# Patient Record
Sex: Female | Born: 1995 | Race: Black or African American | Hispanic: No | Marital: Single | Smoking: Never smoker
Health system: Southern US, Community
[De-identification: ages and names within clinical notes are randomized; demographics above are authoritative.]

## PROBLEM LIST (undated history)

## (undated) DIAGNOSIS — D649 Anemia, unspecified: Secondary | ICD-10-CM

## (undated) DIAGNOSIS — Z789 Other specified health status: Secondary | ICD-10-CM

## (undated) HISTORY — DX: Other specified health status: Z78.9

## (undated) HISTORY — PX: EYE SURGERY: SHX253

## (undated) HISTORY — DX: Anemia, unspecified: D64.9

## (undated) HISTORY — PX: STRABISMUS SURGERY: SHX218

---
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
## 2016-09-26 ENCOUNTER — Encounter: Payer: Self-pay | Admitting: Unknown Physician Specialty

## 2016-09-26 ENCOUNTER — Ambulatory Visit (INDEPENDENT_AMBULATORY_CARE_PROVIDER_SITE_OTHER): Payer: Managed Care, Other (non HMO) | Admitting: Unknown Physician Specialty

## 2016-09-26 VITALS — BP 106/68 | HR 64 | Temp 98.2°F | Ht 67.0 in | Wt 156.2 lb

## 2016-09-26 DIAGNOSIS — Z23 Encounter for immunization: Secondary | ICD-10-CM

## 2016-09-26 DIAGNOSIS — Z0000 Encounter for general adult medical examination without abnormal findings: Secondary | ICD-10-CM | POA: Diagnosis not present

## 2016-09-26 MED ORDER — LEVONORGESTREL-ETHINYL ESTRAD 0.1-20 MG-MCG PO TABS
1.0000 | ORAL_TABLET | Freq: Every day | ORAL | 3 refills | Status: DC
Start: 2016-09-26 — End: 2017-09-28

## 2016-09-26 NOTE — Progress Notes (Signed)
BP 106/68 (BP Location: Left Arm, Patient Position: Sitting, Cuff Size: Normal)   Pulse 64   Temp 98.2 F (36.8 C)   Ht 5\' 7"  (1.702 m)   Wt 156 lb 3.2 oz (70.9 kg)   LMP 09/15/2016 (Approximate)   SpO2 100%   BMI 24.46 kg/m    Subjective:    Patient ID: Janet Roach, female    DOB: 07/14/1996, 20 y.o.   MRN: [id number]  HPI: Janet Roach is a 20 y.o. female  Chief Complaint  Patient presents with  . Annual Exam   Acne Improved with BCPs  Social History   Social History  . Marital status: Single    Spouse name: N/A  . Number of children: N/A  . Years of education: N/A   Occupational History  . Not on file.   Social History Main Topics  . Smoking status: Never Smoker  . Smokeless tobacco: Never Used  . Alcohol use Yes     Comment: on occasion  . Drug use:     Types: Marijuana  . Sexual activity: Yes   Other Topics Concern  . Not on file   Social History Narrative  . No narrative on file   Family History  Problem Relation Age of Onset  . Hypertension Mother   . Diabetes Maternal Grandmother   . Hypertension Maternal Grandmother   . Dementia Maternal Grandmother   . Cancer Paternal Grandmother     breast  . Cancer Paternal Grandfather    History reviewed. No pertinent past medical history.  .   Relevant past medical, surgical, family and social history reviewed and updated as indicated. Interim medical history since our last visit reviewed. Allergies and medications reviewed and updated.  Review of Systems  Per HPI unless specifically indicated above     Objective:    BP 106/68 (BP Location: Left Arm, Patient Position: Sitting, Cuff Size: Normal)   Pulse 64   Temp 98.2 F (36.8 C)   Ht 5\' 7"  (1.702 m)   Wt 156 lb 3.2 oz (70.9 kg)   LMP 09/15/2016 (Approximate)   SpO2 100%   BMI 24.46 kg/m   Wt Readings from Last 3 Encounters:  09/26/16 156 lb 3.2 oz (70.9 kg) (85 %, Z= 1.03)*  09/22/15 174 lb 6.4 oz (79.1 kg) (94 %, Z= 1.53)*    11/21/10 141 lb (64 kg) (88 %, Z= 1.19)*   * Growth percentiles are based on CDC 2-20 Years data.    Physical Exam  Constitutional: She is oriented to person, place, and time. She appears well-developed and well-nourished. No distress.  HENT:  Head: Normocephalic and atraumatic.  Eyes: Conjunctivae and lids are normal. Right eye exhibits no discharge. Left eye exhibits no discharge. No scleral icterus.  Neck: Normal range of motion. Neck supple. No JVD present. Carotid bruit is not present.  Cardiovascular: Normal rate, regular rhythm and normal heart sounds.   Pulmonary/Chest: Effort normal and breath sounds normal.  Abdominal: Soft. Normal appearance and bowel sounds are normal. There is no splenomegaly or hepatomegaly. There is no tenderness.  Musculoskeletal: Normal range of motion.  Neurological: She is alert and oriented to person, place, and time.  Skin: Skin is warm, dry and intact. No rash noted. No pallor.  Psychiatric: She has a normal mood and affect. Her behavior is normal. Judgment and thought content normal.    Results for orders placed or performed in visit on 09/22/15  GC/Chlamydia Probe Amp  Result Value Ref Range  Chlamydia trachomatis, NAA Negative Negative   Neisseria gonorrhoeae by PCR Negative Negative  UA/M w/rflx Culture, Routine  Result Value Ref Range   Specific Gravity, UA 1.020 1.005 - 1.030   pH, UA 7.0 5.0 - 7.5   Color, UA Yellow Yellow   Appearance Ur Cloudy (A) Clear   Leukocytes, UA Negative Negative   Protein, UA Negative Negative/Trace   Glucose, UA Negative Negative   Ketones, UA Trace (A) Negative   RBC, UA Negative Negative   Bilirubin, UA Negative Negative   Urobilinogen, Ur 0.2 0.2 - 1.0 mg/dL   Nitrite, UA Negative Negative  HIV antibody  Result Value Ref Range   HIV Screen 4th Generation wRfx Non Reactive Non Reactive  CBC with Differential/Platelet  Result Value Ref Range   WBC 9.4 3.4 - 10.8 x10E3/uL   RBC 4.16 3.77 - 5.28  x10E6/uL   Hemoglobin 12.2 11.1 - 15.9 g/dL   Hematocrit 16.137.0 09.634.0 - 46.6 %   MCV 89 79 - 97 fL   MCH 29.3 26.6 - 33.0 pg   MCHC 33.0 31.5 - 35.7 g/dL   RDW 04.513.1 40.912.3 - 81.115.4 %   Platelets 275 150 - 379 x10E3/uL   Neutrophils 68 %   Lymphs 25 %   Monocytes 7 %   Eos 0 %   Basos 0 %   Neutrophils Absolute 6.3 1.4 - 7.0 x10E3/uL   Lymphocytes Absolute 2.4 0.7 - 3.1 x10E3/uL   Monocytes Absolute 0.7 0.1 - 0.9 x10E3/uL   EOS (ABSOLUTE) 0.0 0.0 - 0.4 x10E3/uL   Basophils Absolute 0.0 0.0 - 0.2 x10E3/uL   Immature Granulocytes 0 %   Immature Grans (Abs) 0.0 0.0 - 0.1 x10E3/uL  Lipid Panel w/o Chol/HDL Ratio  Result Value Ref Range   Cholesterol, Total 148 100 - 169 mg/dL   Triglycerides 45 0 - 89 mg/dL   HDL 58 >91>39 mg/dL   VLDL Cholesterol Cal 9 5 - 40 mg/dL   LDL Calculated 81 0 - 109 mg/dL  + +    Assessment & Plan:   Problem List Items Addressed This Visit    None    Visit Diagnoses    Need for influenza vaccination    -  Primary   Relevant Orders   Flu Vaccine QUAD 36+ mos IM (Completed)   Annual physical exam           Follow up plan: Return in about 1 year (around 09/26/2017).

## 2016-09-26 NOTE — Patient Instructions (Signed)

## 2017-06-26 LAB — OB RESULTS CONSOLE HIV ANTIBODY (ROUTINE TESTING): HIV: NONREACTIVE

## 2017-06-27 LAB — OB RESULTS CONSOLE VARICELLA ZOSTER ANTIBODY, IGG: Varicella: IMMUNE

## 2017-06-27 LAB — OB RESULTS CONSOLE RUBELLA ANTIBODY, IGM: RUBELLA: IMMUNE

## 2017-06-28 LAB — OB RESULTS CONSOLE GC/CHLAMYDIA
CHLAMYDIA, DNA PROBE: NEGATIVE
Gonorrhea: NEGATIVE

## 2017-07-27 LAB — OB RESULTS CONSOLE RPR: RPR: NONREACTIVE

## 2017-07-27 LAB — OB RESULTS CONSOLE HEPATITIS B SURFACE ANTIGEN: HEP B S AG: NEGATIVE

## 2017-09-28 ENCOUNTER — Encounter: Payer: Self-pay | Admitting: Unknown Physician Specialty

## 2017-09-28 ENCOUNTER — Other Ambulatory Visit: Payer: Self-pay | Admitting: Unknown Physician Specialty

## 2017-09-28 ENCOUNTER — Ambulatory Visit (INDEPENDENT_AMBULATORY_CARE_PROVIDER_SITE_OTHER): Payer: 59 | Admitting: Unknown Physician Specialty

## 2017-09-28 VITALS — BP 118/72 | HR 69 | Temp 98.6°F | Ht 66.8 in | Wt 175.8 lb

## 2017-09-28 DIAGNOSIS — Z0000 Encounter for general adult medical examination without abnormal findings: Secondary | ICD-10-CM | POA: Diagnosis not present

## 2017-09-28 DIAGNOSIS — Z3A24 24 weeks gestation of pregnancy: Secondary | ICD-10-CM | POA: Diagnosis not present

## 2017-09-28 NOTE — Addendum Note (Signed)
Addended by: Gabriel CirriWICKER, Shelbie Franken on: 09/28/2017 12:16 PM   Modules accepted: Orders

## 2017-09-28 NOTE — Assessment & Plan Note (Addendum)
Recommended TDAP on third trimester.  Had a flu shot

## 2017-09-28 NOTE — Progress Notes (Signed)
BP 118/72   Pulse 69   Temp 98.6 F (37 C) (Oral)   Ht 5' 6.8" (1.697 m)   Wt 175 lb 12.8 oz (79.7 kg)   LMP 05/16/2017 (Approximate)   SpO2 100%   BMI 27.70 kg/m    Subjective:    Patient ID: Janet Roach, female    DOB: 10/15/1996, 21 y.o.   MRN: [id number]  HPI: Janet Roach is a 21 y.o. female  Chief Complaint  Patient presents with  . Annual Exam   Pt is [redacted] weeks pregnant at this time.  She is getting [facility]. She is taking pre-natal vitamins.     States her diet and exercise if "fine."    Social History   Socioeconomic History  . Marital status: Single    Spouse name: Not on file  . Number of children: Not on file  . Years of education: Not on file  . Highest education level: Not on file  Social Needs  . Financial resource strain: Not on file  . Food insecurity - worry: Not on file  . Food insecurity - inability: Not on file  . Transportation needs - medical: Not on file  . Transportation needs - non-medical: Not on file  Occupational History  . Not on file  Tobacco Use  . Smoking status: Never Smoker  . Smokeless tobacco: Never Used  Substance and Sexual Activity  . Alcohol use: Yes    Comment: on occasion  . Drug use: No  . Sexual activity: Yes  Other Topics Concern  . Not on file  Social History Narrative  . Not on file   Family History  Problem Relation Age of Onset  . Hypertension Mother   . Diabetes Maternal Grandmother   . Hypertension Maternal Grandmother   . Dementia Maternal Grandmother   . Cancer Paternal Grandmother        breast  . Cancer Paternal Grandfather    History reviewed. No pertinent past medical history.  Past Surgical History:  Procedure Laterality Date  . STRABISMUS SURGERY       Relevant past medical, surgical, family and social history reviewed and updated as indicated. Interim medical history since our last visit reviewed. Allergies and medications reviewed and updated.  Review of  Systems  Constitutional: Negative.   HENT: Negative.   Eyes: Negative.   Respiratory: Negative.   Cardiovascular: Negative.   Gastrointestinal: Negative.   Endocrine: Negative.   Genitourinary: Negative.   Musculoskeletal: Negative.   Skin: Negative.   Allergic/Immunologic: Negative.   Neurological: Negative.   Hematological: Negative.   Psychiatric/Behavioral: Negative.     Per HPI unless specifically indicated above     Objective:    BP 118/72   Pulse 69   Temp 98.6 F (37 C) (Oral)   Ht 5' 6.8" (1.697 m)   Wt 175 lb 12.8 oz (79.7 kg)   LMP 05/16/2017 (Approximate)   SpO2 100%   BMI 27.70 kg/m   Wt Readings from Last 3 Encounters:  09/28/17 175 lb 12.8 oz (79.7 kg)  09/26/16 156 lb 3.2 oz (70.9 kg) (85 %, Z= 1.03)*  09/22/15 174 lb 6.4 oz (79.1 kg) (94 %, Z= 1.53)*   * Growth percentiles are based on CDC (Girls, 2-20 Years) data.    Physical Exam  Constitutional: She is oriented to person, place, and time. She appears well-developed and well-nourished. No distress.  HENT:  Head: Normocephalic and atraumatic.  Eyes: Conjunctivae and lids are normal.  Right eye exhibits no discharge. Left eye exhibits no discharge. No scleral icterus.  Neck: Normal range of motion. Neck supple. No JVD present. Carotid bruit is not present.  Cardiovascular: Normal rate, regular rhythm and normal heart sounds.  Pulmonary/Chest: Effort normal and breath sounds normal.  Abdominal:  Pregnant [redacted] weeks  Musculoskeletal: Normal range of motion.  Neurological: She is alert and oriented to person, place, and time.  Skin: Skin is warm, dry and intact. No rash noted. No pallor.  Psychiatric: She has a normal mood and affect. Her behavior is normal. Judgment and thought content normal.     Assessment & Plan:   Problem List Items Addressed This Visit      Unprioritized   [redacted] weeks gestation of pregnancy    Recommended TDAP on third trimester.  Had a flu shot       Other Visit Diagnoses     Annual physical exam    -  Primary       Follow up plan: Return in about 1 year (around 09/28/2018).

## 2017-09-29 LAB — RPR: RPR: NONREACTIVE

## 2017-09-29 LAB — HIV ANTIBODY (ROUTINE TESTING W REFLEX): HIV Screen 4th Generation wRfx: NONREACTIVE

## 2017-10-01 ENCOUNTER — Encounter: Payer: Self-pay | Admitting: Unknown Physician Specialty

## 2017-10-04 LAB — SPECIMEN STATUS REPORT

## 2017-10-04 LAB — GC/CHLAMYDIA PROBE AMP
Chlamydia trachomatis, NAA: NEGATIVE
Neisseria gonorrhoeae by PCR: NEGATIVE

## 2017-10-05 ENCOUNTER — Encounter: Payer: Self-pay | Admitting: Unknown Physician Specialty

## 2017-10-16 NOTE — L&D Delivery Note (Signed)
Delivery Summary for Janet Roach  Labor Events:   Preterm labor:   Rupture date: 01/10/2018  Rupture time: 11:00 PM  Rupture type: Spontaneous  Fluid Color:   Induction:   Augmentation:   Complications:   Cervical ripening:          Delivery:   Episiotomy:   Lacerations:   Repair suture:   Repair # of packets:   Blood loss (ml): 600   Information for the patient's newborn:  Janet Roach, Janet Roach [[id number]][[id number]]    Delivery 01/11/2018 10:09 PM by  Vaginal, Spontaneous Sex:  female Gestational Age: 5630w4d Delivery Clinician:   Living?:         APGARS  One minute Five minutes Ten minutes  Skin color:        Heart rate:        Grimace:        Muscle tone:        Breathing:        Totals: 8  9      Presentation/position:      Resuscitation:   Cord information:    Disposition of cord blood:     Blood gases sent?  Complications:   Placenta: Delivered:       appearance Newborn Measurements: Weight: 7 lb 7.9 oz (3400 g)  Height: 20"  Head circumference:    Chest circumference:    Other providers:    Additional  information: Forceps:   Vacuum:   Breech:   Observed anomalies       Delivery Note At 10:09 PM a viable and healthy female was delivered via Vaginal, Spontaneous (Presentation: Vertex; ROA position).  APGAR: 8, 9; weight 7 lb 7.9 oz (3400 g).   Placenta status: spontaneously removed, intact.  Cord: 3-vessel, with the following complications: None.  Cord pH: not obtained.  Anesthesia:  Epidural Episiotomy: None Lacerations: 2nd degree;Perineal;Vaginal Suture Repair: 2.0 vicryl Est. Blood Loss (mL):  600  Mom to postpartum.  Baby to Couplet care / Skin to Skin.  Janet Roach 01/11/2018, 10:40 PM

## 2017-10-18 LAB — HM HIV SCREENING LAB: HM HIV Screening: NEGATIVE

## 2017-10-18 LAB — OB RESULTS CONSOLE HIV ANTIBODY (ROUTINE TESTING): HIV: NONREACTIVE

## 2017-10-19 LAB — OB RESULTS CONSOLE RPR: RPR: NONREACTIVE

## 2017-12-16 LAB — OB RESULTS CONSOLE GBS: GBS: NEGATIVE

## 2017-12-17 LAB — OB RESULTS CONSOLE GC/CHLAMYDIA
CHLAMYDIA, DNA PROBE: NEGATIVE
GC PROBE AMP, GENITAL: NEGATIVE

## 2018-01-11 ENCOUNTER — Other Ambulatory Visit: Payer: Self-pay

## 2018-01-11 ENCOUNTER — Inpatient Hospital Stay: Payer: 59 | Admitting: Anesthesiology

## 2018-01-11 ENCOUNTER — Inpatient Hospital Stay
Admission: EM | Admit: 2018-01-11 | Discharge: 2018-01-13 | DRG: 807 | Disposition: A | Discharge status: Home / Self Care | Payer: 59 | Attending: Obstetrics and Gynecology | Admitting: Obstetrics and Gynecology

## 2018-01-11 DIAGNOSIS — Z3A39 39 weeks gestation of pregnancy: Secondary | ICD-10-CM

## 2018-01-11 DIAGNOSIS — O4292 Full-term premature rupture of membranes, unspecified as to length of time between rupture and onset of labor: Secondary | ICD-10-CM | POA: Diagnosis present

## 2018-01-11 DIAGNOSIS — O4202 Full-term premature rupture of membranes, onset of labor within 24 hours of rupture: Secondary | ICD-10-CM | POA: Diagnosis not present

## 2018-01-11 DIAGNOSIS — Z3490 Encounter for supervision of normal pregnancy, unspecified, unspecified trimester: Secondary | ICD-10-CM

## 2018-01-11 DIAGNOSIS — O9081 Anemia of the puerperium: Secondary | ICD-10-CM | POA: Diagnosis not present

## 2018-01-11 LAB — URINE DRUG SCREEN, QUALITATIVE (ARMC ONLY)
Amphetamines, Ur Screen: NOT DETECTED
BARBITURATES, UR SCREEN: NOT DETECTED
BENZODIAZEPINE, UR SCRN: NOT DETECTED
CANNABINOID 50 NG, UR ~~LOC~~: NOT DETECTED
Cocaine Metabolite,Ur ~~LOC~~: NOT DETECTED
MDMA (Ecstasy)Ur Screen: NOT DETECTED
METHADONE SCREEN, URINE: NOT DETECTED
Opiate, Ur Screen: NOT DETECTED
PHENCYCLIDINE (PCP) UR S: NOT DETECTED
Tricyclic, Ur Screen: NOT DETECTED

## 2018-01-11 LAB — CBC
HCT: 33.2 % — ABNORMAL LOW (ref 35.0–47.0)
HEMOGLOBIN: 11.1 g/dL — AB (ref 12.0–16.0)
MCH: 28.1 pg (ref 26.0–34.0)
MCHC: 33.4 g/dL (ref 32.0–36.0)
MCV: 84.1 fL (ref 80.0–100.0)
PLATELETS: 222 10*3/uL (ref 150–440)
RBC: 3.95 MIL/uL (ref 3.80–5.20)
RDW: 16.7 % — ABNORMAL HIGH (ref 11.5–14.5)
WBC: 13.5 10*3/uL — ABNORMAL HIGH (ref 3.6–11.0)

## 2018-01-11 LAB — TYPE AND SCREEN
ABO/RH(D): A POS
ANTIBODY SCREEN: NEGATIVE

## 2018-01-11 LAB — ROM PLUS (ARMC ONLY): ROM PLUS: POSITIVE

## 2018-01-11 MED ORDER — LIDOCAINE HCL (PF) 1 % IJ SOLN: 30.0000 mL | INTRAMUSCULAR | Status: DC | PRN

## 2018-01-11 MED ORDER — FENTANYL 2.5 MCG/ML W/ROPIVACAINE 0.15% IN NS 100 ML EPIDURAL (ARMC)
EPIDURAL | Status: AC
  Filled 2018-01-11: qty 100

## 2018-01-11 MED ORDER — OXYTOCIN 10 UNIT/ML IJ SOLN
INTRAMUSCULAR | Status: AC
  Filled 2018-01-11: qty 2

## 2018-01-11 MED ORDER — LIDOCAINE HCL (PF) 1 % IJ SOLN
INTRAMUSCULAR | Status: DC | PRN
  Administered 2018-01-11: 3 mL via SUBCUTANEOUS

## 2018-01-11 MED ORDER — LIDOCAINE HCL (PF) 1 % IJ SOLN
INTRAMUSCULAR | Status: AC
  Filled 2018-01-11: qty 30

## 2018-01-11 MED ORDER — BUTORPHANOL TARTRATE 1 MG/ML IJ SOLN: 1.0000 mg | INTRAMUSCULAR | Status: DC | PRN

## 2018-01-11 MED ORDER — OXYTOCIN 40 UNITS IN LACTATED RINGERS INFUSION - SIMPLE MED
1.0000 m[IU]/min | INTRAVENOUS | Status: DC
  Administered 2018-01-11: 2 m[IU]/min via INTRAVENOUS
  Filled 2018-01-11: qty 1000

## 2018-01-11 MED ORDER — LACTATED RINGERS IV SOLN
INTRAVENOUS | Status: DC
  Administered 2018-01-11 (×3): via INTRAVENOUS

## 2018-01-11 MED ORDER — AMMONIA AROMATIC IN INHA
RESPIRATORY_TRACT | Status: AC
  Filled 2018-01-11: qty 10

## 2018-01-11 MED ORDER — MISOPROSTOL 200 MCG PO TABS
ORAL_TABLET | ORAL | Status: AC
  Filled 2018-01-11: qty 4

## 2018-01-11 MED ORDER — TERBUTALINE SULFATE 1 MG/ML IJ SOLN: 0.2500 mg | Freq: Once | INTRAMUSCULAR | Status: DC | PRN

## 2018-01-11 MED ORDER — FENTANYL 2.5 MCG/ML W/ROPIVACAINE 0.15% IN NS 100 ML EPIDURAL (ARMC)
EPIDURAL | Status: DC | PRN
  Administered 2018-01-11: 250 ug via EPIDURAL
  Administered 2018-01-11: 12 mL/h via EPIDURAL

## 2018-01-11 MED ORDER — BUPIVACAINE HCL (PF) 0.25 % IJ SOLN
INTRAMUSCULAR | Status: DC | PRN
  Administered 2018-01-11: 5 mL via EPIDURAL
  Administered 2018-01-11: 4 mL via EPIDURAL

## 2018-01-11 MED ORDER — LIDOCAINE-EPINEPHRINE (PF) 1.5 %-1:200000 IJ SOLN
INTRAMUSCULAR | Status: DC | PRN
  Administered 2018-01-11: 3 mL via EPIDURAL

## 2018-01-11 MED ORDER — LACTATED RINGERS IV SOLN
500.0000 mL | INTRAVENOUS | Status: DC | PRN
  Administered 2018-01-11: 1000 mL via INTRAVENOUS

## 2018-01-11 MED ORDER — OXYTOCIN BOLUS FROM INFUSION: 500.0000 mL | Freq: Once | INTRAVENOUS | Status: DC

## 2018-01-11 MED ORDER — OXYTOCIN 40 UNITS IN LACTATED RINGERS INFUSION - SIMPLE MED
2.5000 [IU]/h | INTRAVENOUS | Status: DC
  Filled 2018-01-11: qty 1000

## 2018-01-11 NOTE — Progress Notes (Signed)
Intrapartum Progress Note  S: Patient just had epidural placed.  No complaints. Very drowsy  O: Blood pressure 134/78, pulse 89, temperature 98.1 F (36.7 C), resp. rate 16, height 5\' 9"  (1.753 m), weight 202 lb (91.6 kg), last menstrual period 05/16/2017, SpO2 99 %. Gen App: NAD, comfortable, drowsy.  Abdomen: soft, gravid FHT: baseline 135 bpm.  Accels present.  Decels absent. moderate in degree variability.   Tocometer: contractions q 2-3 minutes Cervix: 4/80/-1/SROM (since ~0200) Extremities: Nontender, no edema.  Pitocin: 8 mIU  Labs:  No new labs   Assessment:  1: SIUP at 3125w4d 2. PROM, GBS neg  Plan:  1. Continue Pitocin augmentation per protocol 2. S/p epidural placement 3. Anticipate vaginal delivery   Hildred Laserherry, Houston Surges, MD 01/11/2018 11:43 AM

## 2018-01-11 NOTE — H&P (Signed)
History and Physical   HPI  Janet Roach is a 22 y.o. G1P0 at 5647w4d Estimated Date of Delivery: 01/14/18 who is being admitted for  labor management.  Pt had SROM sometime late yesterday evening.  [facility] and was sent home at 3cm dilation.  She presented here and was found to be 3 cm and having irregular contractions.  She c/o leakage of fluid.  A ROM + was performed and was c/w ROM.   OB History  OB History  Gravida Para Term Preterm AB Living  1 0 0 0 0 0  SAB TAB Ectopic Multiple Live Births  0 0 0 0 0    # Outcome Date GA Lbr Len/2nd Weight Sex Delivery Anes PTL Lv  1 Current             PROBLEM LIST  Pregnancy complications or risks: Patient Active Problem List   Diagnosis Date Noted  . Pregnancy 01/11/2018  . [redacted] weeks gestation of pregnancy 09/28/2017    Prenatal labs and studies: ABO, Rh: --/--/A POS (03/29 0515) Antibody: NEG (03/29 0515) Rubella:   RPR: Nonreactive (01/04 0000)  HBsAg: Negative (10/12 0000)  HIV: Non-reactive (01/03 0000)  ZOX:[id number]:Negative (03/03 0000)   History reviewed. No pertinent past medical history.   Past Surgical History:  Procedure Laterality Date  . STRABISMUS SURGERY       Medications    Current Discharge Medication List    CONTINUE these medications which have NOT CHANGED   Details  ferrous sulfate 325 (65 FE) MG tablet Take 325 mg by mouth daily with breakfast.    Prenatal Vit-Fe Fumarate-FA (PRENATAL MULTIVITAMIN) TABS tablet Take 1 tablet by mouth daily at 12 noon.         Allergies  Patient has no known allergies.  Review of Systems  Pertinent items are noted in HPI.  Physical Exam  BP 120/68 (BP Location: Right Arm)   Pulse 97   Temp 98.1 F (36.7 C) (Oral)   Resp 16   Ht 5\' 9"  (1.753 m)   Wt 202 lb (91.6 kg)   LMP 05/16/2017 (Approximate)   BMI 29.83 kg/m   Lungs:  CTA B Cardio: RRR without M/R/G Abd: Soft, gravid, NT Presentation: cephalic EXT: No C/C/ 1+  Edema DTRs: 2+ B CERVIX: 3 cm 90%  SROM - confirmed.  See Prenatal records for more detailed PE.     FHR:  Variability: Good {> 6 bpm)  Toco: Uterine Contractions: Irregular - Mild   Test Results  Results for orders placed or performed during the hospital encounter of 01/11/18 (from the past 24 hour(s))  ROM Plus ([facility] only)     Status: None   Collection Time: 01/11/18  2:58 AM  Result Value Ref Range   Rom Plus POSITIVE   CBC     Status: Abnormal   Collection Time: 01/11/18  5:15 AM  Result Value Ref Range   WBC 13.5 (H) 3.6 - 11.0 K/uL   RBC 3.95 3.80 - 5.20 MIL/uL   Hemoglobin 11.1 (L) 12.0 - 16.0 g/dL   HCT 98.133.2 (L) 19.135.0 - 47.847.0 %   MCV 84.1 80.0 - 100.0 fL   MCH 28.1 26.0 - 34.0 pg   MCHC 33.4 32.0 - 36.0 g/dL   RDW 29.516.7 (H) 62.111.5 - 30.814.5 %   Platelets 222 150 - 440 K/uL  Type and screen [facility]     Status: None   Collection Time: 01/11/18  5:15 AM  Result Value Ref Range   ABO/RH(D) A POS    Antibody Screen NEG    Sample Expiration      01/14/2018 Performed at [facility], [address] [place], [place] [address]    Group B Strep negative  Assessment   G1P0 at [redacted]w[redacted]d Estimated Date of Delivery: 01/14/18  The fetus is reassuring.  SROM  Patient Active Problem List   Diagnosis Date Noted  . Pregnancy 01/11/2018  . [redacted] weeks gestation of pregnancy 09/28/2017    Plan  1. Admit to L&D :   continue present management, IV Pitocin augmentation and anticipate vaginal delivery 2. EFM: -- Category 1 3. Epidural if desired. Stadol for IV pain until epidural requested. 4. Admission labs    Elonda Husky, M.D. 01/11/2018 8:31 AM

## 2018-01-11 NOTE — Anesthesia Preprocedure Evaluation (Signed)
Anesthesia Evaluation  Patient identified by MRN, date of birth, ID band Patient awake    Reviewed: Allergy & Precautions, H&P , NPO status , Patient's Chart, lab work & pertinent test results  History of Anesthesia Complications Negative for: history of anesthetic complications  Airway Mallampati: II       Dental no notable dental hx. (+) Teeth Intact   Pulmonary           Cardiovascular      Neuro/Psych    GI/Hepatic   Endo/Other    Renal/GU      Musculoskeletal   Abdominal   Peds  Hematology   Anesthesia Other Findings   Reproductive/Obstetrics (+) Pregnancy                             Anesthesia Physical Anesthesia Plan  ASA: II  Anesthesia Plan: Epidural   Post-op Pain Management:    Induction:   PONV Risk Score and Plan:   Airway Management Planned:   Additional Equipment:   Intra-op Plan:   Post-operative Plan:   Informed Consent: I have reviewed the patients History and Physical, chart, labs and discussed the procedure including the risks, benefits and alternatives for the proposed anesthesia with the patient or authorized representative who has indicated his/her understanding and acceptance.       Plan Discussed with: Anesthesiologist  Anesthesia Plan Comments:         Anesthesia Quick Evaluation  

## 2018-01-11 NOTE — Progress Notes (Signed)
Intrapartum Progress Note  S: Patient feeling pressure with contractions.  Epidural in place.   O: Blood pressure (!) 135/92, pulse (!) 108, temperature 98.3 F (36.8 C), temperature source Oral, resp. rate 16, height 5\' 9"  (1.753 m), weight 202 lb (91.6 kg), last menstrual period 05/16/2017, SpO2 99 %. Gen App: NAD, comfortable, drowsy.  Abdomen: soft, gravid FHT: baseline 15 bpm.  Accels present.  Decels absent. moderate in degree variability.   Tocometer: contractions q 2-3 minutes Cervix: 6/100/0/SROM (since ~0200), cervix mildly edematous.  Extremities: Nontender, no edema.  Pitocin: 18 mIU  Labs:  No new labs   Assessment:  1: SIUP at 7866w4d 2. PROM, GBS neg  Plan:  1. Continue Pitocin augmentation per protocol 2. IUPC placed if need to increase pitocin above 20 mIU. MVU's appear to be adequate currently.  3. Anticipate vaginal delivery   Hildred Laserherry, Rayce Brahmbhatt, MD 01/11/2018 5:37 PM

## 2018-01-11 NOTE — OB Triage Note (Signed)
Pt arrival to triage with c/o contractions every 2-753min for 2 days.  Pt also reports leaking of fluid.  Denies vaginal bleeding.  Pt states she is feeling baby move normally.  EFM and toco applied and tracing.

## 2018-01-11 NOTE — Anesthesia Procedure Notes (Signed)
Epidural  Start time: 01/11/2018 11:19 AM End time: 01/11/2018 11:32 AM  Staffing Anesthesiologist: Naomie DeanKephart, William K, MD Resident/CRNA: Irving BurtonBachich, Tatia Petrucci, CRNA Performed: resident/CRNA   Preanesthetic Checklist Completed: patient identified, site marked, surgical consent, pre-op evaluation, IV checked, risks and benefits discussed and monitors and equipment checked  Epidural Patient position: sitting Prep: Betadine Patient monitoring: heart rate, continuous pulse ox and blood pressure Approach: midline Location: L3-L4 Injection technique: LOR air  Needle:  Needle type: Tuohy  Needle gauge: 17 G Needle length: 9 cm Needle insertion depth: 7 cm Catheter type: closed end flexible Catheter size: 19 Gauge Catheter at skin depth: 11 cm  Assessment Events: blood not aspirated, injection not painful, no injection resistance, negative IV test and no paresthesia  Additional Notes Reason for block:procedure for pain

## 2018-01-12 ENCOUNTER — Encounter: Payer: Self-pay | Admitting: Anesthesiology

## 2018-01-12 DIAGNOSIS — O4292 Full-term premature rupture of membranes, unspecified as to length of time between rupture and onset of labor: Secondary | ICD-10-CM

## 2018-01-12 DIAGNOSIS — O9081 Anemia of the puerperium: Secondary | ICD-10-CM

## 2018-01-12 LAB — CBC
HEMATOCRIT: 28.1 % — AB (ref 35.0–47.0)
HEMOGLOBIN: 9.1 g/dL — AB (ref 12.0–16.0)
MCH: 27.4 pg (ref 26.0–34.0)
MCHC: 32.4 g/dL (ref 32.0–36.0)
MCV: 84.6 fL (ref 80.0–100.0)
Platelets: 194 10*3/uL (ref 150–440)
RBC: 3.32 MIL/uL — ABNORMAL LOW (ref 3.80–5.20)
RDW: 17.1 % — ABNORMAL HIGH (ref 11.5–14.5)
WBC: 16.7 10*3/uL — ABNORMAL HIGH (ref 3.6–11.0)

## 2018-01-12 LAB — RAPID HIV SCREEN (HIV 1/2 AB+AG)
HIV 1/2 ANTIBODIES: NONREACTIVE
HIV-1 P24 Antigen - HIV24: NONREACTIVE

## 2018-01-12 LAB — RPR: RPR Ser Ql: NONREACTIVE

## 2018-01-12 MED ORDER — IBUPROFEN 800 MG PO TABS
800.0000 mg | ORAL_TABLET | Freq: Four times a day (QID) | ORAL | Status: DC
  Administered 2018-01-12 – 2018-01-13 (×7): 800 mg via ORAL
  Filled 2018-01-12 (×7): qty 1

## 2018-01-12 MED ORDER — SENNOSIDES-DOCUSATE SODIUM 8.6-50 MG PO TABS
2.0000 | ORAL_TABLET | ORAL | Status: DC
  Administered 2018-01-12 – 2018-01-13 (×2): 2 via ORAL
  Filled 2018-01-12 (×2): qty 2

## 2018-01-12 MED ORDER — TETANUS-DIPHTH-ACELL PERTUSSIS 5-2.5-18.5 LF-MCG/0.5 IM SUSP: 0.5000 mL | Freq: Once | INTRAMUSCULAR | Status: DC

## 2018-01-12 MED ORDER — ZOLPIDEM TARTRATE 5 MG PO TABS: 5.0000 mg | ORAL_TABLET | Freq: Every evening | ORAL | Status: DC | PRN

## 2018-01-12 MED ORDER — ACETAMINOPHEN 325 MG PO TABS: 650.0000 mg | ORAL_TABLET | ORAL | Status: DC | PRN

## 2018-01-12 MED ORDER — COCONUT OIL OIL
1.0000 | TOPICAL_OIL | Status: DC | PRN
Start: 2018-01-12 — End: 2018-01-13

## 2018-01-12 MED ORDER — ONDANSETRON HCL 4 MG/2ML IJ SOLN
4.0000 mg | INTRAMUSCULAR | Status: DC | PRN
Start: 2018-01-12 — End: 2018-01-13

## 2018-01-12 MED ORDER — PRENATAL MULTIVITAMIN CH
1.0000 | ORAL_TABLET | Freq: Every day | ORAL | Status: DC
  Administered 2018-01-12 – 2018-01-13 (×2): 1 via ORAL
  Filled 2018-01-12 (×2): qty 1

## 2018-01-12 MED ORDER — DIBUCAINE 1 % RE OINT: 1.0000 "application " | TOPICAL_OINTMENT | RECTAL | Status: DC | PRN

## 2018-01-12 MED ORDER — WITCH HAZEL-GLYCERIN EX PADS: 1.0000 "application " | MEDICATED_PAD | CUTANEOUS | Status: DC | PRN

## 2018-01-12 MED ORDER — DIPHENHYDRAMINE HCL 25 MG PO CAPS: 25.0000 mg | ORAL_CAPSULE | Freq: Four times a day (QID) | ORAL | Status: DC | PRN

## 2018-01-12 MED ORDER — SIMETHICONE 80 MG PO CHEW: 80.0000 mg | CHEWABLE_TABLET | ORAL | Status: DC | PRN

## 2018-01-12 MED ORDER — ONDANSETRON HCL 4 MG PO TABS: 4.0000 mg | ORAL_TABLET | ORAL | Status: DC | PRN

## 2018-01-12 MED ORDER — BENZOCAINE-MENTHOL 20-0.5 % EX AERO: 1.0000 "application " | INHALATION_SPRAY | CUTANEOUS | Status: DC | PRN

## 2018-01-12 NOTE — Anesthesia Postprocedure Evaluation (Signed)
Anesthesia Post Note  Patient: Perel Claassen  Procedure(s) Performed: AN AD HOC LABOR EPIDURAL  Patient location during evaluation: Mother Baby Anesthesia Type: Epidural Level of consciousness: awake and alert Pain management: pain level controlled Vital Signs Assessment: post-procedure vital signs reviewed and stable Respiratory status: spontaneous breathing, nonlabored ventilation and respiratory function stable Cardiovascular status: stable Postop Assessment: no headache, no backache and epidural receding Anesthetic complications: no     Last Vitals:  Vitals:   01/12/18 0614 01/12/18 0742  BP: (!) 120/59 126/82  Pulse: 86 91  Resp: 16 20  Temp: 36.9 C 36.5 C  SpO2: 99% 100%    Last Pain:  Vitals:   01/12/18 0742  TempSrc: Oral  PainSc:                  Dempsy Damiano S     

## 2018-01-12 NOTE — Anesthesia Postprocedure Evaluation (Signed)
Anesthesia Post Note  Patient: Janet Roach  Procedure(s) Performed: AN AD HOC LABOR EPIDURAL  Patient location during evaluation: Mother Baby Anesthesia Type: Epidural Level of consciousness: awake and alert Pain management: pain level controlled Vital Signs Assessment: post-procedure vital signs reviewed and stable Respiratory status: spontaneous breathing, nonlabored ventilation and respiratory function stable Cardiovascular status: stable Postop Assessment: no headache, no backache and epidural receding Anesthetic complications: no     Last Vitals:  Vitals:   01/12/18 0614 01/12/18 0742  BP: (!) 120/59 126/82  Pulse: 86 91  Resp: 16 20  Temp: 36.9 C 36.5 C  SpO2: 99% 100%    Last Pain:  Vitals:   01/12/18 0742  TempSrc: Oral  PainSc:                  Colvin Blatt S

## 2018-01-12 NOTE — Progress Notes (Signed)
Post Partum Day # 1, s/p SVD  Subjective: no complaints, up ad lib, voiding and tolerating PO  Objective: Temp:  [97.7 F (36.5 C)-99.7 F (37.6 C)] 97.7 F (36.5 C) (03/30 0742) Pulse Rate:  [64-125] 91 (03/30 0742) Resp:  [16-20] 20 (03/30 0742) BP: (84-146)/(38-94) 126/82 (03/30 0742) SpO2:  [96 %-100 %] 100 % (03/30 0742)  Physical Exam:  General: alert and no distress  Lungs: clear to auscultation bilaterally Breasts: normal appearance, no masses or tenderness Heart: regular rate and rhythm, S1, S2 normal, no murmur, click, rub or gallop Abdomen: soft, non-tender; bowel sounds normal; no masses,  no organomegaly Pelvis: Lochia: appropriate, Uterine Fundus: firm Extremities: DVT Evaluation: No evidence of DVT seen on physical exam. Negative Homan's sign. No cords or calf tenderness. No significant calf/ankle edema.  Recent Labs    01/11/18 0515 01/12/18 0614  HGB 11.1* 9.1*  HCT 33.2* 28.1*    Assessment/Plan: Doing well postpartum  Breastfeeding and formula feeding, Lactation consult as needed.  Circumcision prior to discharge  Contraception combined OCPs.  Plan for discharge tomorrow.     LOS: 1 day   Hildred Laserherry, Victorina Kable, MD [facility]

## 2018-01-13 DIAGNOSIS — O9081 Anemia of the puerperium: Secondary | ICD-10-CM

## 2018-01-13 DIAGNOSIS — O4292 Full-term premature rupture of membranes, unspecified as to length of time between rupture and onset of labor: Secondary | ICD-10-CM

## 2018-01-13 LAB — VARICELLA ZOSTER ANTIBODY, IGG: Varicella IgG: <135 index — ABNORMAL LOW (ref >165)

## 2018-01-13 LAB — RUBELLA SCREEN: Rubella: 5.84 index (ref >0.99)

## 2018-01-13 MED ORDER — IBUPROFEN 800 MG PO TABS: 800.0000 mg | ORAL_TABLET | Freq: Three times a day (TID) | ORAL | 1 refills | Status: DC | PRN

## 2018-01-13 MED ORDER — DOCUSATE SODIUM 100 MG PO CAPS: 100.0000 mg | ORAL_CAPSULE | Freq: Two times a day (BID) | ORAL | 2 refills | Status: AC | PRN

## 2018-01-13 NOTE — Discharge Summary (Signed)
OB Discharge Summary     Patient Name: Janet Roach DOB: May 14, 1996 MRN: [id number]  Date of admission: 01/11/2018 Delivering MD: Hildred Laser   Date of discharge: 01/13/2018  Admitting diagnosis: 39 weeks, PROM Intrauterine pregnancy: [redacted]w[redacted]d  Secondary diagnosis:  Active Problems:   Pregnancy  Additional problems: None     Discharge diagnosis: Term Pregnancy Delivered and Anemia                                                                                                Post partum procedures:None  Augmentation: Pitocin  Complications: None  Hospital course:  Induction of Labor With Vaginal Delivery   22 y.o. yo G1P1001 at [redacted]w[redacted]d was admitted to the hospital 01/11/2018 for induction of labor .  Indication for induction: PROM.  Patient had an uncomplicated labor course as follows: Membrane Rupture Time/Date: 11:00 PM ,01/10/2018   Intrapartum Procedures: Episiotomy: None [1]                                         Lacerations:  2nd degree [3];Perineal [11];Vaginal [6]  Patient had delivery of a Viable infant.  Information for the patient's newborn:  Sharonlee, Nine [[id number]]  Delivery Method: Vag-Spont   01/11/2018  Details of delivery can be found in separate delivery note.  Patient had a routine postpartum course. Patient is discharged home 01/13/18.  Physical exam  Vitals:   01/12/18 1948 01/12/18 1956 01/13/18 0025 01/13/18 0859  BP: 140/90 118/78 127/65 131/78  Pulse: (!) 105 96 76 89  Resp: 18  20 20   Temp: 97.9 F (36.6 C)  98.1 F (36.7 C) 98.1 F (36.7 C)  TempSrc: Oral  Oral Axillary  SpO2: 98%  100% 100%  Weight:      Height:       General: alert and no distress Lochia: appropriate Uterine Fundus: firm Incision: N/A DVT Evaluation: No evidence of DVT seen on physical exam. Negative Homan's sign. No cords or calf tenderness.   Labs: Lab Results  Component Value Date   WBC 16.7 (H) 01/12/2018   HGB 9.1 (L) 01/12/2018   HCT 28.1 (L)  01/12/2018   MCV 84.6 01/12/2018   PLT 194 01/12/2018   No flowsheet data found.  Discharge instruction: per After Visit Summary and "Baby and Me Booklet".  After visit meds:  Allergies as of 01/13/2018   No Known Allergies     Medication List    TAKE these medications   docusate sodium 100 MG capsule Commonly known as:  COLACE Take 1 capsule (100 mg total) by mouth 2 (two) times daily as needed for mild constipation.   ferrous sulfate 325 (65 FE) MG tablet Take 325 mg by mouth daily with breakfast.   ibuprofen 800 MG tablet Commonly known as:  ADVIL,MOTRIN Take 1 tablet (800 mg total) by mouth every 8 (eight) hours as needed.   prenatal multivitamin Tabs tablet Take 1 tablet by mouth daily at 12 noon.  Diet: routine diet  Activity: Advance as tolerated. Pelvic rest for 6 weeks.   Outpatient follow up:6 weeks at [facility] Follow up Appt: Future Appointments  Date Time Provider Department Center  10/01/2018  8:00 AM Gabriel CirriWicker, Cheryl, NP [facility]   Follow up Visit:No follow-ups on file.  Postpartum contraception: Combination OCPs  Newborn Data: Live born female  Birth Weight: 7 lb 7.9 oz (3400 g) APGAR: 8, 9  Newborn Delivery   Birth date/time:  01/11/2018 22:09:00 Delivery type:  Vaginal, Spontaneous     Baby Feeding: Bottle and Breast Disposition:home with mother   01/13/2018 Hildred LaserAnika Senora Lacson, MD

## 2018-01-13 NOTE — Progress Notes (Signed)
Post Partum Day # 2, s/p SVD  Subjective: no complaints, up ad lib, voiding and tolerating PO.  Patient notes she is ready to go home today.   Objective: Vitals:   01/12/18 1948 01/12/18 1956 01/13/18 0025 01/13/18 0859  BP: 140/90 118/78 127/65 131/78  Pulse: (!) 105 96 76 89  Resp: 18  20 20   Temp: 97.9 F (36.6 C)  98.1 F (36.7 C) 98.1 F (36.7 C)  TempSrc: Oral  Oral Axillary  SpO2: 98%  100% 100%  Weight:      Height:        Physical Exam:  General: alert and no distress  Lungs: clear to auscultation bilaterally Breasts: normal appearance, no masses or tenderness Heart: regular rate and rhythm, S1, S2 normal, no murmur, click, rub or gallop Abdomen: soft, non-tender; bowel sounds normal; no masses,  no organomegaly Pelvis: Lochia: appropriate, Uterine Fundus: firm Extremities: DVT Evaluation: No evidence of DVT seen on physical exam. Negative Homan's sign. No cords or calf tenderness. No significant calf/ankle edema.  Recent Labs    01/11/18 0515 01/12/18 0614  HGB 11.1* 9.1*  HCT 33.2* 28.1*    Assessment/Plan: Doing well postpartum  Breastfeeding and formula feeding, Lactation consult as needed.  Circumcision will be done at Pediatrician office.  Contraception combined OCPs. Mild anemia postpartum, will prescribe daily iron.   Plan for discharge today.  Will f/u at [facility].      LOS: 2 days   Hildred Laserherry, Rosco Harriott, MD [facility]

## 2018-01-13 NOTE — Progress Notes (Signed)
Discharge instructions given. Patient verbalizes understanding of teaching. Patient discharged home via wheelchair at 1400. 

## 2018-02-18 DIAGNOSIS — E663 Overweight: Secondary | ICD-10-CM | POA: Insufficient documentation

## 2018-02-18 LAB — HM PAP SMEAR: HM Pap smear: NEGATIVE

## 2018-10-01 ENCOUNTER — Encounter: Payer: Self-pay | Admitting: Nurse Practitioner

## 2018-10-01 ENCOUNTER — Encounter: Payer: 59 | Admitting: Unknown Physician Specialty

## 2019-05-13 ENCOUNTER — Telehealth: Payer: Self-pay | Admitting: Family Medicine

## 2019-05-13 NOTE — Telephone Encounter (Signed)
Returned patient phone call. Patient has scheduled FP appt 05/23/2019 for method revisit. Patient stated she will finish her pills on 05/20/2019. Instructed patient to abstain from sex once pills are finished on 05/20/2019 and to keep scheduled appt on 05/23/2019. Patient agreeable and verbalized understanding of same.  Hal Morales, RN

## 2019-05-22 DIAGNOSIS — E663 Overweight: Secondary | ICD-10-CM

## 2019-05-23 ENCOUNTER — Other Ambulatory Visit: Payer: Self-pay

## 2019-05-23 ENCOUNTER — Ambulatory Visit (LOCAL_COMMUNITY_HEALTH_CENTER): Payer: Medicaid Other

## 2019-05-23 VITALS — BP 109/78 | Wt 191.0 lb

## 2019-05-23 DIAGNOSIS — Z3009 Encounter for other general counseling and advice on contraception: Secondary | ICD-10-CM

## 2019-05-24 MED ORDER — NORGESTIM-ETH ESTRAD TRIPHASIC 0.18/0.215/0.25 MG-25 MCG PO TABS: 1.0000 | ORAL_TABLET | Freq: Every day | ORAL | 5 refills | Status: DC

## 2019-05-24 NOTE — Patient Instructions (Signed)
Call for appt when patient opens last pack of pills.

## 2019-05-24 NOTE — Progress Notes (Signed)
Rx for Trisprintec called into [facility] per C. [place] PA VO Aileen Fass, RN

## 2020-02-29 ENCOUNTER — Other Ambulatory Visit: Payer: Self-pay | Admitting: Physician Assistant

## 2020-03-02 NOTE — Telephone Encounter (Signed)
Per chart review, patient with last RP 02/18/2018.  CBE due 2019 and pap due 2022.  Patient needs to RTC for annual visit prior to any further refills.

## 2020-10-16 NOTE — L&D Delivery Note (Addendum)
       Delivery Note   Janet Roach is a 25 y.o. G2P2002 at [redacted]w[redacted]d Estimated Date of Delivery: 08/19/21  PRE-OPERATIVE DIAGNOSIS:  1) [redacted]w[redacted]d pregnancy.  2) Labor   POST-OPERATIVE DIAGNOSIS:  1) [redacted]w[redacted]d pregnancy s/p   2) Viable female infant  Delivery Type:   Spontaneous vaginal delivery  Delivery Anesthesia:  Epidural  Labor Complications:  Mod meconium    ESTIMATED BLOOD LOSS: 250 ml    FINDINGS:   1) female infant, Apgar scores of    at 1 minute and    at 5 minutes and a birthweight of   ounces.    2) Nuchal cord: No  SPECIMENS:   PLACENTA:   Appearance:     Removal:       Disposition:    DISPOSITION:  Infant to left in stable condition in the delivery room, with L&D personnel and mother,  NARRATIVE SUMMARY: Labor course:  [name] is a [id number] at [redacted]w[redacted]d who presented for labor management.  She progressed well in labor without pitocin.  She received the appropriate anesthesia and proceeded to complete dilation. AROM was performed.  Moderate meconium was noted. She evidenced good maternal expulsive effort during the second stage. She went on to deliver a viable infant. The placenta delivered without problems and was noted to be complete. A perineal and vaginal examination was performed. Episiotomy/Lacerations: Small R labial tear.  Episiotomy or lacerations were repaired with Vicryl suture. The patient tolerated this well.  Elonda Husky, M.D. 08/24/2021 3:55 PM

## 2020-12-02 ENCOUNTER — Other Ambulatory Visit: Payer: Self-pay | Admitting: Physician Assistant

## 2020-12-04 ENCOUNTER — Other Ambulatory Visit: Payer: Self-pay | Admitting: Physician Assistant

## 2021-01-06 ENCOUNTER — Other Ambulatory Visit: Payer: Self-pay

## 2021-01-06 ENCOUNTER — Ambulatory Visit (LOCAL_COMMUNITY_HEALTH_CENTER): Payer: Medicaid Other

## 2021-01-06 VITALS — BP 135/79 | Ht 69.0 in | Wt 176.5 lb

## 2021-01-06 DIAGNOSIS — Z3201 Encounter for pregnancy test, result positive: Secondary | ICD-10-CM | POA: Diagnosis not present

## 2021-01-06 LAB — PREGNANCY, URINE: Preg Test, Ur: POSITIVE — AB

## 2021-01-06 MED ORDER — PRENATAL 27-0.8 MG PO TABS: 1.0000 | ORAL_TABLET | Freq: Every day | ORAL | 0 refills | Status: AC

## 2021-01-06 NOTE — Progress Notes (Signed)
UPT positive. Plans prenatal care at [facility]. To clerk for preadmit. Jerel Shepherd, RN

## 2021-01-27 NOTE — Progress Notes (Signed)
Abstract done per phone interview Henriette Combs RN 01/27/2021

## 2021-01-31 ENCOUNTER — Other Ambulatory Visit: Payer: Self-pay

## 2021-01-31 ENCOUNTER — Ambulatory Visit: Payer: Medicaid Other | Admitting: Family Medicine

## 2021-01-31 ENCOUNTER — Encounter: Payer: Self-pay | Admitting: Family Medicine

## 2021-01-31 VITALS — BP 125/87 | HR 62 | Temp 97.1°F | Ht 68.0 in | Wt 175.0 lb

## 2021-01-31 DIAGNOSIS — Z3480 Encounter for supervision of other normal pregnancy, unspecified trimester: Secondary | ICD-10-CM | POA: Insufficient documentation

## 2021-01-31 DIAGNOSIS — Z3401 Encounter for supervision of normal first pregnancy, first trimester: Secondary | ICD-10-CM | POA: Diagnosis not present

## 2021-01-31 DIAGNOSIS — Z3481 Encounter for supervision of other normal pregnancy, first trimester: Secondary | ICD-10-CM

## 2021-01-31 LAB — URINALYSIS
Bilirubin, UA: NEGATIVE
Glucose, UA: NEGATIVE
Ketones, UA: NEGATIVE
Leukocytes,UA: NEGATIVE
Nitrite, UA: NEGATIVE
Protein,UA: NEGATIVE
RBC, UA: NEGATIVE
Specific Gravity, UA: 1.02 (ref 1.005–1.030)
Urobilinogen, Ur: 0.2 mg/dL (ref 0.2–1.0)
pH, UA: 7 (ref 5.0–7.5)

## 2021-01-31 LAB — WET PREP FOR TRICH, YEAST, CLUE
Clue Cell Exam: POSITIVE — AB
Trichomonas Exam: NEGATIVE
Yeast Exam: NEGATIVE

## 2021-01-31 LAB — HEMOGLOBIN, FINGERSTICK: Hemoglobin: 13.6 g/dL (ref 11.1–15.9)

## 2021-01-31 NOTE — Progress Notes (Signed)
Patient here for new OB visit.  Patient reports daily vomiting in the morning but previously it was vomiting BID.   Wet prep reviewed with provider, Wendi Snipes, FNP. No further treatment indicated.   Hgb reviewed. WNL.   Urine drip reviewed with provider. WNL.   Floy Sabina, RN

## 2021-01-31 NOTE — Progress Notes (Signed)
[facility] HEALTH DEPT [facility] [address] [place] [address] [place] [address] [phone]  INITIAL PRENATAL VISIT NOTE  Subjective:  Janet Roach is a 25 y.o. G2P1001 at [redacted]w[redacted]d being seen today to start prenatal care at the [facility] Department.  She is currently monitored for the following issues for this low-risk pregnancy and has Overweight and Supervision of other normal pregnancy, antepartum on their problem list.  Patient reports that she was unsure about the pregnancy but is not good .  Patient lives with 3 yrs son here in [place], and works in [place].  FOB lives in [place] and is supportive of this pregnancy, he is currently living with a friend.     Patient denies any history of physical or sexual abuse, chronic medical issues, allergies or substance abuse. Last ETOH was 01/10/21.     Patient denies any concerning OB history including no preterm labor, LBW delivery or previous cesarean.  Patient desires to deliver at [facility].    Patient reports no complaints.  Contractions: Not present. Vag. Bleeding: None.  Movement: Absent. Denies leaking of fluid.   Indications for ASA therapy (per uptodate) One of the following: Previous pregnancy with preeclampsia, especially early onset and with an adverse outcome No Multifetal gestation No Chronic hypertension No Type 1 or 2 diabetes mellitus No Chronic kidney disease No Autoimmune disease (antiphospholipid syndrome, systemic lupus erythematosus) No  Two or more of the following: Nulliparity No Obesity (body mass index >30 kg/m2) No Family history of preeclampsia in mother or sister Yes Age ?35 years No Sociodemographic characteristics (African American race, low socioeconomic level) Yes Personal risk factors (eg, previous pregnancy with low birth weight or small for gestational age infant, previous adverse pregnancy outcome [eg, stillbirth], interval >10 years between  pregnancies) No   The following portions of the patient's history were reviewed and updated as appropriate: allergies, current medications, past family history, past medical history, past social history, past surgical history and problem list. Problem list updated.  Objective:   Vitals:   01/31/21 1327  BP: 125/87  Pulse: 62  Temp: (!) 97.1 F (36.2 C)  Weight: 175 lb (79.4 kg)  Height: 5\' 8"  (1.727 m)    Fetal Status:   Fundal Height: 11 cm Movement: Absent     Physical Exam Vitals and nursing note reviewed.  Constitutional:      General: She is not in acute distress.    Appearance: Normal appearance. She is well-developed.  HENT:     Head: Normocephalic and atraumatic.     Right Ear: External ear normal.     Left Ear: External ear normal.     Nose: Nose normal. No congestion or rhinorrhea.     Mouth/Throat:     Lips: Pink.     Mouth: Mucous membranes are moist.     Dentition: Normal dentition. No dental caries.     Pharynx: Oropharynx is clear. Uvula midline.     Comments: Dentition: teeth present no visible carries  Eyes:     General: No scleral icterus.    Conjunctiva/sclera: Conjunctivae normal.  Neck:     Thyroid: No thyroid mass or thyromegaly.  Cardiovascular:     Rate and Rhythm: Normal rate.     Pulses: Normal pulses.     Comments: Extremities are warm and well perfused Pulmonary:     Effort: Pulmonary effort is normal.     Breath sounds: Normal breath sounds.  Chest:  Chest wall: No mass.  Breasts:     Tanner Score is 5. Breasts are symmetrical.     Right: Normal. No mass, nipple discharge, skin change or axillary adenopathy.     Left: Normal. No mass, nipple discharge, skin change or axillary adenopathy.    Abdominal:     Palpations: Abdomen is soft.     Tenderness: There is no abdominal tenderness.     Comments: Gravid   Genitourinary:    General: Normal vulva.     Exam position: Lithotomy position.     Pubic Area: No rash.      Labia:         Right: No rash.        Left: No rash.      Vagina: Normal. No vaginal discharge.     Cervix: No cervical motion tenderness or friability.     Uterus: Normal. Enlarged (Gravid 11 size). Not tender.      Adnexa: Right adnexa normal and left adnexa normal.     Rectum: Normal. No external hemorrhoid.     Comments: External genitalia without, lice, nits, erythema, edema , lesions or inguinal adenopathy. Vagina with normal mucosa and discharge and pH equals 4.  Cervix without visual lesions, uterus firm, mobile, non-tender, no masses, CMT adnexal fullness or tenderness.  Musculoskeletal:     Cervical back: Normal range of motion and neck supple.     Right lower leg: No edema.     Left lower leg: No edema.  Lymphadenopathy:     Cervical: No cervical adenopathy.     Upper Body:     Right upper body: No axillary adenopathy.     Left upper body: No axillary adenopathy.  Skin:    General: Skin is warm.     Capillary Refill: Capillary refill takes less than 2 seconds.  Neurological:     Mental Status: She is alert and oriented to person, place, and time.  Psychiatric:        Behavior: Behavior normal.     Assessment and Plan:  Pregnancy: G2P1001 at [redacted]w[redacted]d  1. Encounter for supervision of normal first pregnancy in first trimester  - Dating: has sure LMP -11/12/2020, no dating U/S indicated. Referral sent for U/S.  - Genetic screening: declined FIRST, - Pregnancy sx: reports having vomiting in the mornings and some nausea throughout the day. Counseling given on preventive measures, informational sheet given.  - Up to date on dental care, last visit 1 month ago.  Reviewed safety of routine care in pregnancy  - Has support system that is involved including FOB - Routine labs completed. Pap done today.   - Vaccinations: declined Covid & flu today.  - Has two minor risk factors for preeclampsia. Recommended ASA 81mg  daily for preeclampsia prevention. Discussed starting at 11-13 weeks and  continuing through pregnancy. We will touch base at next appointment to see if she started this medication.     Discussed overview of care and coordination with inpatient delivery practices including [facility], , [facility] and [facility].    Preterm labor symptoms and general obstetric precautions including but not limited to vaginal bleeding, contractions, leaking of fluid and fetal movement were reviewed in detail with the patient.  Please refer to After Visit Summary for other counseling recommendations.   Return in about 4 weeks (around 02/28/2021) for routine prenatal care.  Future Appointments  Date Time Provider Department Center  02/28/2021  4:00 PM [facility] PROVIDER [facility] None  03/25/2021  2:30  PM [facility]    Wendi Snipes, FNP

## 2021-02-01 ENCOUNTER — Telehealth: Payer: Self-pay | Admitting: Family Medicine

## 2021-02-01 ENCOUNTER — Telehealth: Payer: Self-pay

## 2021-02-01 NOTE — Telephone Encounter (Signed)
Return call from client and [place] appt given in addition to verbal directions to faciltiy. Client verbalized understanding of above. Jossie Ng, RN

## 2021-02-01 NOTE — Telephone Encounter (Signed)
Call to [facility] scheduler and anatomy US scheduled on 03/25/2021 at 1430 with arrival time of 1415 with full bladder. Call to client and left message requesting callback for [place] appt. Number to call provided. Jossie Ng, RN

## 2021-02-01 NOTE — Telephone Encounter (Signed)
Please call me in regards to my ultrasound appointment.

## 2021-02-02 LAB — CBC/D/PLT+RPR+RH+ABO+AB SCR
Antibody Screen: NEGATIVE
Basophils Absolute: 0 10*3/uL (ref 0.0–0.2)
Basos: 0 %
EOS (ABSOLUTE): 0.1 10*3/uL (ref 0.0–0.4)
Eos: 1 %
Hematocrit: 39 % (ref 34.0–46.6)
Hemoglobin: 13.3 g/dL (ref 11.1–15.9)
Hepatitis B Surface Ag: NEGATIVE
Immature Grans (Abs): 0 10*3/uL (ref 0.0–0.1)
Immature Granulocytes: 0 %
Lymphocytes Absolute: 2.6 10*3/uL (ref 0.7–3.1)
Lymphs: 25 %
MCH: 29.4 pg (ref 26.6–33.0)
MCHC: 34.1 g/dL (ref 31.5–35.7)
MCV: 86 fL (ref 79–97)
Monocytes Absolute: 0.9 10*3/uL (ref 0.1–0.9)
Monocytes: 8 %
Neutrophils Absolute: 6.7 10*3/uL (ref 1.4–7.0)
Neutrophils: 66 %
Platelets: 245 10*3/uL (ref 150–450)
RBC: 4.53 x10E6/uL (ref 3.77–5.28)
RDW: 12.9 % (ref 11.7–15.4)
RPR Ser Ql: NONREACTIVE
Rh Factor: POSITIVE
WBC: 10.2 10*3/uL (ref 3.4–10.8)

## 2021-02-02 LAB — HCV INTERPRETATION

## 2021-02-02 LAB — PAP IG (IMAGE GUIDED): PAP Smear Comment: 0

## 2021-02-02 LAB — HIV-1/HIV-2 QUALITATIVE RNA
HIV-1 RNA, Qualitative: NONREACTIVE
HIV-2 RNA, Qualitative: NONREACTIVE

## 2021-02-02 LAB — HCV AB W REFLEX TO QUANT PCR: HCV Ab: <0.1 s/co ratio (ref 0.0–0.9)

## 2021-02-02 LAB — HGB FRACTIONATION CASCADE
Hgb A2: 2.7 % (ref 1.8–3.2)
Hgb A: 97.3 % (ref 96.4–98.8)
Hgb F: 0 % (ref 0.0–2.0)
Hgb S: 0 %

## 2021-02-02 LAB — URINE CULTURE: Organism ID, Bacteria: NO GROWTH

## 2021-02-02 LAB — TSH: TSH: 0.963 u[IU]/mL (ref 0.450–4.500)

## 2021-02-02 LAB — CHLAMYDIA/GC NAA, CONFIRMATION
Chlamydia trachomatis, NAA: NEGATIVE
Neisseria gonorrhoeae, NAA: NEGATIVE

## 2021-02-12 LAB — 789231 7+OXYCODONE-BUND
Amphetamines, Urine: NEGATIVE ng/mL
BENZODIAZ UR QL: NEGATIVE ng/mL
Barbiturate screen, urine: NEGATIVE ng/mL
Cocaine (Metab.): NEGATIVE ng/mL
OPIATE SCREEN URINE: NEGATIVE ng/mL
Oxycodone/Oxymorphone, Urine: NEGATIVE ng/mL
PCP Quant, Ur: NEGATIVE ng/mL

## 2021-02-12 LAB — CANNABINOID CONFIRMATION, UR
CANNABINOIDS: POSITIVE — AB
Carboxy THC GC/MS Conf: 69 ng/mL

## 2021-02-28 ENCOUNTER — Ambulatory Visit: Payer: Medicaid Other | Admitting: Family Medicine

## 2021-02-28 ENCOUNTER — Other Ambulatory Visit: Payer: Self-pay

## 2021-02-28 DIAGNOSIS — Z3480 Encounter for supervision of other normal pregnancy, unspecified trimester: Secondary | ICD-10-CM

## 2021-02-28 DIAGNOSIS — Z3482 Encounter for supervision of other normal pregnancy, second trimester: Secondary | ICD-10-CM

## 2021-02-28 NOTE — Progress Notes (Signed)
   PRENATAL VISIT NOTE  Subjective:  Janet Roach is a 25 y.o. G2P1001 at [redacted]w[redacted]d being seen today for ongoing prenatal care.  She is currently monitored for the following issues for this low-risk pregnancy and has Overweight and Supervision of other normal pregnancy, antepartum on their problem list.  Patient reports no complaints.  Contractions: Not present. Vag. Bleeding: None.  Movement: Present. Denies leaking of fluid/ROM.   The following portions of the patient's history were reviewed and updated as appropriate: allergies, current medications, past family history, past medical history, past social history, past surgical history and problem list. Problem list updated.  Objective:  There were no vitals filed for this visit.  Fetal Status: Fetal Heart Rate (bpm): 145 Fundal Height: 15 cm Movement: Present     General:  Alert, oriented and cooperative. Patient is in no acute distress.  Skin: Skin is warm and dry. No rash noted.   Cardiovascular: Normal heart rate noted  Respiratory: Normal respiratory effort, no problems with respiration noted  Abdomen: Soft, gravid, appropriate for gestational age.  Pain/Pressure: Absent     Pelvic: Cervical exam deferred        Extremities: Normal range of motion.  Edema: None  Mental Status: Normal mood and affect. Normal behavior. Normal judgment and thought content.   Assessment and Plan:  Pregnancy: G2P1001 at [redacted]w[redacted]d  1. Supervision of other normal pregnancy, antepartum - discussed with patient about + UDS  - pt reports last used marijuana 1st week in April.  Informed would be doing UDS per trimester.   -discussed diet and starting exercise- consider you tube if weather doesn't permit.  -still having some nausea- discussed methods to prevent nausea.   - pt working, no problems with job at this time.   -pt aware of U/S appt on 03/25/21.      Preterm labor symptoms and general obstetric precautions including but not limited to vaginal bleeding,  contractions, leaking of fluid and fetal movement were reviewed in detail with the patient. Please refer to After Visit Summary for other counseling recommendations.  No follow-ups on file.  Future Appointments  Date Time Provider Department Center  03/25/2021  2:30 PM [facility]  03/28/2021  4:00 PM [facility] PROVIDER [facility] None    Wendi Snipes, FNP

## 2021-02-28 NOTE — Progress Notes (Signed)
Aware of [facility] [place] appt 03/25/2021 with arrival time of 1415 with a full bladder (no milk or gatorade). Declined quad screen. Jossie Ng, RN  Wet mount negative. Jossie Ng, RN

## 2021-03-09 NOTE — Addendum Note (Signed)
Addended by: Heywood Bene on: 03/09/2021 03:56 PM   Modules accepted: Orders

## 2021-03-25 ENCOUNTER — Ambulatory Visit
Admission: RE | Admit: 2021-03-25 | Discharge: 2021-03-25 | Disposition: A | Discharge status: Home / Self Care | Payer: Medicaid Other | Source: Ambulatory Visit | Attending: Family Medicine | Admitting: Family Medicine

## 2021-03-25 ENCOUNTER — Other Ambulatory Visit: Payer: Self-pay

## 2021-03-25 DIAGNOSIS — Z3A19 19 weeks gestation of pregnancy: Secondary | ICD-10-CM | POA: Diagnosis not present

## 2021-03-25 DIAGNOSIS — Z3480 Encounter for supervision of other normal pregnancy, unspecified trimester: Secondary | ICD-10-CM | POA: Diagnosis not present

## 2021-03-25 DIAGNOSIS — Z362 Encounter for other antenatal screening follow-up: Secondary | ICD-10-CM | POA: Diagnosis not present

## 2021-03-28 ENCOUNTER — Ambulatory Visit: Payer: Medicaid Other | Admitting: Advanced Practice Midwife

## 2021-03-28 ENCOUNTER — Encounter: Payer: Self-pay | Admitting: Family Medicine

## 2021-03-28 VITALS — BP 103/67 | HR 82 | Temp 98.1°F | Wt 190.4 lb

## 2021-03-28 DIAGNOSIS — R825 Elevated urine levels of drugs, medicaments and biological substances: Secondary | ICD-10-CM

## 2021-03-28 DIAGNOSIS — Z3480 Encounter for supervision of other normal pregnancy, unspecified trimester: Secondary | ICD-10-CM

## 2021-03-28 LAB — URINALYSIS
Bilirubin, UA: NEGATIVE
Glucose, UA: NEGATIVE
Ketones, UA: NEGATIVE
Nitrite, UA: NEGATIVE
Protein,UA: NEGATIVE
RBC, UA: NEGATIVE
Specific Gravity, UA: 1.02 (ref 1.005–1.030)
Urobilinogen, Ur: 0.2 mg/dL (ref 0.2–1.0)
pH, UA: 7.5 (ref 5.0–7.5)

## 2021-03-28 NOTE — Progress Notes (Signed)
Pt denies visits to ER since last visit at [facility]. Taking PNV.

## 2021-03-28 NOTE — Progress Notes (Signed)
   PRENATAL VISIT NOTE  Subjective:  Janet Roach is a 25 y.o. G2P1001 at [redacted]w[redacted]d being seen today for ongoing prenatal care.  She is currently monitored for the following issues for this low-risk pregnancy and has Overweight; Supervision of other normal pregnancy, antepartum; and Positive urine drug screen +MJ 01/31/21 on their problem list.  Patient reports no complaints.  Contractions: Not present. Vag. Bleeding: None.  Movement: Present. Denies leaking of fluid/ROM.   The following portions of the patient's history were reviewed and updated as appropriate: allergies, current medications, past family history, past medical history, past social history, past surgical history and problem list. Problem list updated.  Objective:   Vitals:   03/28/21 1612  BP: 103/67  Pulse: 82  Temp: 98.1 F (36.7 C)  Weight: 190 lb 6.4 oz (86.4 kg)    Fetal Status: Fetal Heart Rate (bpm): 150 Fundal Height: 19 cm Movement: Present     General:  Alert, oriented and cooperative. Patient is in no acute distress.  Skin: Skin is warm and dry. No rash noted.   Cardiovascular: Normal heart rate noted  Respiratory: Normal respiratory effort, no problems with respiration noted  Abdomen: Soft, gravid, appropriate for gestational age.  Pain/Pressure: Absent     Pelvic: Cervical exam deferred        Extremities: Normal range of motion.  Edema: None  Mental Status: Normal mood and affect. Normal behavior. Normal judgment and thought content.   Assessment and Plan:  Pregnancy: G2P1001 at [redacted]w[redacted]d  1. Positive urine drug screen +MJ 01/31/21 States last MJ use March 2022; agrees to UDS today - 784696 Drug Screen  2. Supervision of other normal pregnancy, antepartum Last vomited 2 wks ago 7 lb 6.4 oz (3.357 kg). Counseled on weight gain of 15-25 lbs Working 28-30 hours/wk. Not exercising--encouraged to walk or swim 3x/wk Living with boyfriend and son.  Declined FIRST and Quad screen Reviewed 03/25/21 u/s with AFI  wnl, posterior placenta with no previa, anatomy wnl, at 18.6 wks. This is first u/s this pregnancy as no need for dating u/s due to sure LMP Not taking ASA 81 mg daily  - Urinalysis (Urine Dip)   Preterm labor symptoms and general obstetric precautions including but not limited to vaginal bleeding, contractions, leaking of fluid and fetal movement were reviewed in detail with the patient. Please refer to After Visit Summary for other counseling recommendations.  Return in about 4 weeks (around 04/25/2021) for routine PNC.  No future appointments.  Alberteen Spindle, CNM

## 2021-03-28 NOTE — Progress Notes (Signed)
UA reviewed by provider; no tx for UA. Pt scheduled to RTC on 04/25/21. Provider orders completed.

## 2021-03-29 LAB — 789231 7+OXYCODONE-BUND
Amphetamines, Urine: NEGATIVE ng/mL
BENZODIAZ UR QL: NEGATIVE ng/mL
Barbiturate screen, urine: NEGATIVE ng/mL
Cannabinoid Quant, Ur: NEGATIVE ng/mL
Cocaine (Metab.): NEGATIVE ng/mL
OPIATE SCREEN URINE: NEGATIVE ng/mL
Oxycodone/Oxymorphone, Urine: NEGATIVE ng/mL
PCP Quant, Ur: NEGATIVE ng/mL

## 2021-04-25 ENCOUNTER — Ambulatory Visit: Payer: Medicaid Other | Admitting: Family Medicine

## 2021-04-25 ENCOUNTER — Other Ambulatory Visit: Payer: Self-pay

## 2021-04-25 VITALS — BP 108/76 | HR 78 | Temp 97.6°F | Wt 197.4 lb

## 2021-04-25 DIAGNOSIS — Z3482 Encounter for supervision of other normal pregnancy, second trimester: Secondary | ICD-10-CM

## 2021-04-25 DIAGNOSIS — Z3480 Encounter for supervision of other normal pregnancy, unspecified trimester: Secondary | ICD-10-CM

## 2021-04-26 NOTE — Progress Notes (Signed)
   PRENATAL VISIT NOTE  Subjective:  Janet Roach is a 25 y.o. G2P1001 at [redacted]w[redacted]d being seen today for ongoing prenatal care.  She is currently monitored for the following issues for this low-risk pregnancy and has Overweight; Supervision of other normal pregnancy, antepartum; and Positive urine drug screen +MJ 01/31/21 on their problem list.  Patient reports no complaints.  Contractions: Not present. Vag. Bleeding: None.  Movement: Present. Denies leaking of fluid/ROM.   The following portions of the patient's history were reviewed and updated as appropriate: allergies, current medications, past family history, past medical history, past social history, past surgical history and problem list. Problem list updated.  Objective:   Vitals:   04/25/21 1658  BP: 108/76  Pulse: 78  Temp: 97.6 F (36.4 C)  Weight: 197 lb 6.4 oz (89.5 kg)    Fetal Status: Fetal Heart Rate (bpm): 145 Fundal Height: 23 cm Movement: Present     General:  Alert, oriented and cooperative. Patient is in no acute distress.  Skin: Skin is warm and dry. No rash noted.   Cardiovascular: Normal heart rate noted  Respiratory: Normal respiratory effort, no problems with respiration noted  Abdomen: Soft, gravid, appropriate for gestational age.  Pain/Pressure: Absent     Pelvic: Cervical exam deferred        Extremities: Normal range of motion.  Edema: None  Mental Status: Normal mood and affect. Normal behavior. Normal judgment and thought content.   Assessment and Plan:  Pregnancy: G2P1001 at [redacted]w[redacted]d  1. Supervision of other normal pregnancy, antepartum -Anatomy US addendum to [place], sex of baby is female  -Nausea gone  -Nutrition/exercise: discussed with patient about exercise while pregnant.    - Anticipated guidance 28 weeks labs done at next visit.     Preterm labor symptoms and general obstetric precautions including but not limited to vaginal bleeding, contractions, leaking of fluid and fetal movement were  reviewed in detail with the patient. Please refer to After Visit Summary for other counseling recommendations.  No follow-ups on file.  Future Appointments  Date Time Provider Department Center  05/16/2021  3:00 PM [facility] PROVIDER [facility] None    Wendi Snipes, FNP

## 2021-05-09 DIAGNOSIS — H52213 Irregular astigmatism, bilateral: Secondary | ICD-10-CM | POA: Diagnosis not present

## 2021-05-11 DIAGNOSIS — H5213 Myopia, bilateral: Secondary | ICD-10-CM | POA: Diagnosis not present

## 2021-05-16 ENCOUNTER — Other Ambulatory Visit: Payer: Self-pay

## 2021-05-16 ENCOUNTER — Ambulatory Visit: Payer: Medicaid Other | Admitting: Advanced Practice Midwife

## 2021-05-16 VITALS — BP 118/73 | HR 83 | Temp 83.0°F | Wt 203.8 lb

## 2021-05-16 DIAGNOSIS — Z3480 Encounter for supervision of other normal pregnancy, unspecified trimester: Secondary | ICD-10-CM

## 2021-05-16 DIAGNOSIS — Z3482 Encounter for supervision of other normal pregnancy, second trimester: Secondary | ICD-10-CM

## 2021-05-16 DIAGNOSIS — R825 Elevated urine levels of drugs, medicaments and biological substances: Secondary | ICD-10-CM

## 2021-05-16 NOTE — Progress Notes (Addendum)
Desires to change delivery provider to [facility]. Aware to sign consent with clerical wihen schedules next appt. Jossie Ng, RN Urine dip reviwed by Hazle Coca CNM and client counseled to increase daily amount of water / fluids per Ms. Sciora CNM. Jossie Ng, RN

## 2021-05-16 NOTE — Progress Notes (Signed)
   PRENATAL VISIT NOTE  Subjective:  Janet Roach is a 25 y.o. G2P1001 at [redacted]w[redacted]d being seen today for ongoing prenatal care.  She is currently monitored for the following issues for this low-risk pregnancy and has Overweight; Supervision of other normal pregnancy, antepartum; and Positive urine drug screen +MJ 01/31/21 on their problem list.  Patient reports no complaints.  Contractions: Not present. Vag. Bleeding: None.  Movement: Present. Denies leaking of fluid/ROM.   The following portions of the patient's history were reviewed and updated as appropriate: allergies, current medications, past family history, past medical history, past social history, past surgical history and problem list. Problem list updated.  Objective:   Vitals:   05/16/21 1527  BP: 118/73  Pulse: 83  Temp: (!) 83 F (28.3 C)  Weight: 203 lb 12.8 oz (92.4 kg)    Fetal Status: Fetal Heart Rate (bpm): 150 Fundal Height: 27 cm Movement: Present     General:  Alert, oriented and cooperative. Patient is in no acute distress.  Skin: Skin is warm and dry. No rash noted.   Cardiovascular: Normal heart rate noted  Respiratory: Normal respiratory effort, no problems with respiration noted  Abdomen: Soft, gravid, appropriate for gestational age.  Pain/Pressure: Absent     Pelvic: Cervical exam deferred        Extremities: Normal range of motion.  Edema: None  Mental Status: Normal mood and affect. Normal behavior. Normal judgment and thought content.   Assessment and Plan:  Pregnancy: G2P1001 at [redacted]w[redacted]d  1. Supervision of other normal pregnancy, antepartum 20 lb 12.8 oz (9.435 kg) Workiing 28-30 hours/wk. Walking 3x/wk at work x 1 mile - Urinalysis (Urine Dip)  2. Positive urine drug screen +MJ 01/31/21 UDS neg on 03/28/21 States not using MJ anymore; agrees to UDS today - [id number] Drug Screen   Preterm labor symptoms and general obstetric precautions including but not limited to vaginal bleeding, contractions,  leaking of fluid and fetal movement were reviewed in detail with the patient. Please refer to After Visit Summary for other counseling recommendations.  Return in about 2 weeks (around 05/30/2021) for 28 week labs.  No future appointments.  Alberteen Spindle, CNM

## 2021-05-17 LAB — URINALYSIS
Bilirubin, UA: NEGATIVE
Glucose, UA: NEGATIVE
Ketones, UA: NEGATIVE
Nitrite, UA: NEGATIVE
RBC, UA: NEGATIVE
Specific Gravity, UA: 1.02 (ref 1.005–1.030)
Urobilinogen, Ur: 2 mg/dL — ABNORMAL HIGH (ref 0.2–1.0)
pH, UA: 7 (ref 5.0–7.5)

## 2021-05-17 LAB — 789231 7+OXYCODONE-BUND
Amphetamines, Urine: NEGATIVE ng/mL
BENZODIAZ UR QL: NEGATIVE ng/mL
Barbiturate screen, urine: NEGATIVE ng/mL
Cannabinoid Quant, Ur: NEGATIVE ng/mL
Cocaine (Metab.): NEGATIVE ng/mL
OPIATE SCREEN URINE: NEGATIVE ng/mL
Oxycodone/Oxymorphone, Urine: NEGATIVE ng/mL
PCP Quant, Ur: NEGATIVE ng/mL

## 2021-05-30 ENCOUNTER — Other Ambulatory Visit: Payer: Self-pay

## 2021-05-30 ENCOUNTER — Ambulatory Visit: Payer: Medicaid Other | Admitting: Advanced Practice Midwife

## 2021-05-30 VITALS — BP 111/73 | HR 89 | Temp 98.0°F | Wt 205.4 lb

## 2021-05-30 DIAGNOSIS — O99019 Anemia complicating pregnancy, unspecified trimester: Secondary | ICD-10-CM | POA: Insufficient documentation

## 2021-05-30 DIAGNOSIS — Z23 Encounter for immunization: Secondary | ICD-10-CM

## 2021-05-30 DIAGNOSIS — Z3480 Encounter for supervision of other normal pregnancy, unspecified trimester: Secondary | ICD-10-CM

## 2021-05-30 DIAGNOSIS — O99013 Anemia complicating pregnancy, third trimester: Secondary | ICD-10-CM | POA: Insufficient documentation

## 2021-05-30 DIAGNOSIS — Z3483 Encounter for supervision of other normal pregnancy, third trimester: Secondary | ICD-10-CM | POA: Diagnosis not present

## 2021-05-30 LAB — HEMOGLOBIN, FINGERSTICK: Hemoglobin: 9.4 g/dL — ABNORMAL LOW (ref 11.1–15.9)

## 2021-05-30 MED ORDER — FERROUS SULFATE 325 (65 FE) MG PO TABS: 325.0000 mg | ORAL_TABLET | Freq: Three times a day (TID) | ORAL | 0 refills | Status: DC

## 2021-05-30 NOTE — Progress Notes (Signed)
   PRENATAL VISIT NOTE  Subjective:  Janet Roach is a 25 y.o. G2P1001 at [redacted]w[redacted]d being seen today for ongoing prenatal care.  She is currently monitored for the following issues for this low-risk pregnancy and has Overweight; Supervision of other normal pregnancy, antepartum; and Positive urine drug screen +MJ 01/31/21 on their problem list.  Patient reports no complaints.  Contractions: Not present. Vag. Bleeding: None.  Movement: Present. Denies leaking of fluid/ROM.   The following portions of the patient's history were reviewed and updated as appropriate: allergies, current medications, past family history, past medical history, past social history, past surgical history and problem list. Problem list updated.  Objective:  There were no vitals filed for this visit.  Fetal Status: Fetal Heart Rate (bpm): 140 Fundal Height: 28 cm Movement: Present     General:  Alert, oriented and cooperative. Patient is in no acute distress.  Skin: Skin is warm and dry. No rash noted.   Cardiovascular: Normal heart rate noted  Respiratory: Normal respiratory effort, no problems with respiration noted  Abdomen: Soft, gravid, appropriate for gestational age.  Pain/Pressure: Absent     Pelvic: Cervical exam deferred        Extremities: Normal range of motion.  Edema: None  Mental Status: Normal mood and affect. Normal behavior. Normal judgment and thought content.   Assessment and Plan:  Pregnancy: G2P1001 at [redacted]w[redacted]d  1. Prenatal care, subsequent pregnancy, third trimester 20 lb 12.8 oz (9.435 kg); craving peanut butter jelly sandwiches Working 25 hrs/wk Walking 3x/wk x 20-30 min.  Baby shower 07/30/21 28 wk labs today - RPR - HIV-1/HIV-2 Qualitative RNA - Glucose, 1 hour gestational - Hemoglobin, venipuncture  2. Supervision of other normal pregnancy, antepartum   Preterm labor symptoms and general obstetric precautions including but not limited to vaginal bleeding, contractions, leaking of  fluid and fetal movement were reviewed in detail with the patient. Please refer to After Visit Summary for other counseling recommendations.  Return in about 2 weeks (around 06/13/2021) for routine PNC.  No future appointments.  Alberteen Spindle, CNM

## 2021-05-30 NOTE — Progress Notes (Signed)
Counseled on recommendation for Tdap in pregnancy and for those people who will spend time with infant. Client tolerated Tdap today without complaint. Hgb = 9.4 and iron initiated per standing order. Anemia panel added to labs. Jossie Ng, RN

## 2021-05-31 LAB — FE+CBC/D/PLT+TIBC+FER+RETIC
Basophils Absolute: 0 10*3/uL (ref 0.0–0.2)
Basos: 0 %
EOS (ABSOLUTE): 0.1 10*3/uL (ref 0.0–0.4)
Eos: 1 %
Ferritin: 6 ng/mL — ABNORMAL LOW (ref 15–150)
Hematocrit: 28.8 % — ABNORMAL LOW (ref 34.0–46.6)
Hemoglobin: 9.5 g/dL — ABNORMAL LOW (ref 11.1–15.9)
Immature Grans (Abs): 0.1 10*3/uL (ref 0.0–0.1)
Immature Granulocytes: 1 %
Iron Saturation: 5 % — CL (ref 15–55)
Iron: 20 ug/dL — ABNORMAL LOW (ref 27–159)
Lymphocytes Absolute: 1.9 10*3/uL (ref 0.7–3.1)
Lymphs: 17 %
MCH: 27.8 pg (ref 26.6–33.0)
MCHC: 33 g/dL (ref 31.5–35.7)
MCV: 84 fL (ref 79–97)
Monocytes Absolute: 1 10*3/uL — ABNORMAL HIGH (ref 0.1–0.9)
Monocytes: 9 %
Neutrophils Absolute: 8.1 10*3/uL — ABNORMAL HIGH (ref 1.4–7.0)
Neutrophils: 72 %
Platelets: 219 10*3/uL (ref 150–450)
RBC: 3.42 x10E6/uL — ABNORMAL LOW (ref 3.77–5.28)
RDW: 12.3 % (ref 11.7–15.4)
Retic Ct Pct: 2.1 % (ref 0.6–2.6)
Total Iron Binding Capacity: 420 ug/dL (ref 250–450)
UIBC: 400 ug/dL (ref 131–425)
WBC: 11.1 10*3/uL — ABNORMAL HIGH (ref 3.4–10.8)

## 2021-05-31 LAB — GLUCOSE, 1 HOUR GESTATIONAL: Gestational Diabetes Screen: 72 mg/dL (ref 65–139)

## 2021-05-31 LAB — RPR: RPR Ser Ql: NONREACTIVE

## 2021-06-01 LAB — HIV-1/HIV-2 QUALITATIVE RNA
HIV-1 RNA, Qualitative: NONREACTIVE
HIV-2 RNA, Qualitative: NONREACTIVE

## 2021-06-13 ENCOUNTER — Ambulatory Visit: Payer: Medicaid Other | Admitting: Advanced Practice Midwife

## 2021-06-13 ENCOUNTER — Telehealth: Payer: Self-pay

## 2021-06-13 ENCOUNTER — Other Ambulatory Visit: Payer: Self-pay

## 2021-06-13 ENCOUNTER — Ambulatory Visit: Payer: Medicaid Other

## 2021-06-13 VITALS — BP 121/75 | HR 111 | Temp 97.5°F | Wt 211.0 lb

## 2021-06-13 DIAGNOSIS — Z3483 Encounter for supervision of other normal pregnancy, third trimester: Secondary | ICD-10-CM

## 2021-06-13 DIAGNOSIS — O99013 Anemia complicating pregnancy, third trimester: Secondary | ICD-10-CM

## 2021-06-13 DIAGNOSIS — Z3480 Encounter for supervision of other normal pregnancy, unspecified trimester: Secondary | ICD-10-CM

## 2021-06-13 DIAGNOSIS — R825 Elevated urine levels of drugs, medicaments and biological substances: Secondary | ICD-10-CM

## 2021-06-13 NOTE — Telephone Encounter (Signed)
Call to client at 0815 and left message requesting she call [facility] to reschedule appt due to staffing issue. Repeated call to client at 0930 and reschedule [facility] RV appt for 1300 today. Jossie Ng, RN

## 2021-06-13 NOTE — Progress Notes (Signed)
   PRENATAL VISIT NOTE  Subjective:  Janet Roach is a 25 y.o. G2P1001 at [redacted]w[redacted]d being seen today for ongoing prenatal care.  She is currently monitored for the following issues for this low-risk pregnancy and has Overweight; Supervision of other normal pregnancy, antepartum; Positive urine drug screen +MJ 01/31/21; and Anemia in pregnancy on their problem list.  Patient reports no complaints.  Contractions: Not present. Vag. Bleeding: None.  Movement: Present. Denies leaking of fluid/ROM.   The following portions of the patient's history were reviewed and updated as appropriate: allergies, current medications, past family history, past medical history, past social history, past surgical history and problem list. Problem list updated.  Objective:   Vitals:   06/13/21 1308  BP: 121/75  Pulse: (!) 111  Temp: (!) 97.5 F (36.4 C)  Weight: 211 lb (95.7 kg)    Fetal Status: Fetal Heart Rate (bpm): 120 Fundal Height: 30 cm Movement: Present     General:  Alert, oriented and cooperative. Patient is in no acute distress.  Skin: Skin is warm and dry. No rash noted.   Cardiovascular: Normal heart rate noted  Respiratory: Normal respiratory effort, no problems with respiration noted  Abdomen: Soft, gravid, appropriate for gestational age.  Pain/Pressure: Absent     Pelvic: Cervical exam deferred        Extremities: Normal range of motion.  Edema: None  Mental Status: Normal mood and affect. Normal behavior. Normal judgment and thought content.   Assessment and Plan:  Pregnancy: G2P1001 at [redacted]w[redacted]d  1. Anemia during pregnancy in third trimester Taking FeSo4 TID with oj   2. Supervision of other normal pregnancy, antepartum 28 lb (12.7 kg) Walking not as often now due to heat Working 25 hours/wk 05/30/21 1 hour glucola=72 Still craving peanut butter sandwiches  3. Positive urine drug screen +MJ 01/31/21 Pt denies use  Preterm labor symptoms and general obstetric precautions including but  not limited to vaginal bleeding, contractions, leaking of fluid and fetal movement were reviewed in detail with the patient. Please refer to After Visit Summary for other counseling recommendations.  Return in about 2 weeks (around 06/27/2021) for routine PNC.  No future appointments.  Alberteen Spindle, CNM

## 2021-06-13 NOTE — Progress Notes (Signed)
Correctly verbalizes how to take iron and PNV. Encouraged to take iron tablet everyday with Vitamin C beverage. Jossie Ng, RN

## 2021-06-27 ENCOUNTER — Other Ambulatory Visit: Payer: Self-pay

## 2021-06-27 ENCOUNTER — Encounter: Payer: Self-pay | Admitting: Advanced Practice Midwife

## 2021-06-27 ENCOUNTER — Ambulatory Visit: Payer: Medicaid Other | Admitting: Advanced Practice Midwife

## 2021-06-27 VITALS — BP 126/77 | HR 84 | Temp 97.6°F | Wt 216.0 lb

## 2021-06-27 DIAGNOSIS — Z3480 Encounter for supervision of other normal pregnancy, unspecified trimester: Secondary | ICD-10-CM

## 2021-06-27 DIAGNOSIS — O99013 Anemia complicating pregnancy, third trimester: Secondary | ICD-10-CM

## 2021-06-27 DIAGNOSIS — F5089 Other specified eating disorder: Secondary | ICD-10-CM

## 2021-06-27 DIAGNOSIS — R825 Elevated urine levels of drugs, medicaments and biological substances: Secondary | ICD-10-CM

## 2021-06-27 DIAGNOSIS — E669 Obesity, unspecified: Secondary | ICD-10-CM | POA: Insufficient documentation

## 2021-06-27 DIAGNOSIS — Z3483 Encounter for supervision of other normal pregnancy, third trimester: Secondary | ICD-10-CM

## 2021-06-27 HISTORY — DX: Other specified eating disorder: F50.89

## 2021-06-27 LAB — HEMOGLOBIN, FINGERSTICK: Hemoglobin: 8.4 g/dL — ABNORMAL LOW (ref 11.1–15.9)

## 2021-06-27 MED ORDER — FERROUS GLUCONATE 240 (27 FE) MG PO TABS: 240.0000 mg | ORAL_TABLET | Freq: Three times a day (TID) | ORAL | 0 refills | Status: DC

## 2021-06-27 NOTE — Progress Notes (Signed)
Patient here for [facility] RV at 32 3/7. Kick counts reviewed and cards given. Hbg check today.Burt Knack, RN

## 2021-06-27 NOTE — Progress Notes (Signed)
Patient hgb today 8.4. Per provider, patient given ferrous gluconate to take 3x/day with juice. Patient states understanding and agrees with plan. Counseled to call if she has problems/questions.Burt Knack, RN

## 2021-06-27 NOTE — Progress Notes (Signed)
   PRENATAL VISIT NOTE  Subjective:  Janet Roach is a 25 y.o. G2P1001 at [redacted]w[redacted]d being seen today for ongoing prenatal care.  She is currently monitored for the following issues for this low-risk pregnancy and has Supervision of other normal pregnancy, antepartum; Positive urine drug screen +MJ 01/31/21; Anemia in pregnancy; Obesity BMI=32.8; and Pica ice 2-3 c/day on their problem list.  Patient reports no complaints.  Contractions: Not present. Vag. Bleeding: None.  Movement: Present. Denies leaking of fluid/ROM.   The following portions of the patient's history were reviewed and updated as appropriate: allergies, current medications, past family history, past medical history, past social history, past surgical history and problem list. Problem list updated.  Objective:   Vitals:   06/27/21 1049  BP: 126/77  Pulse: 84  Temp: 97.6 F (36.4 C)  Weight: 216 lb (98 kg)    Fetal Status: Fetal Heart Rate (bpm): 120 Fundal Height: 31 cm Movement: Present     General:  Alert, oriented and cooperative. Patient is in no acute distress.  Skin: Skin is warm and dry. No rash noted.   Cardiovascular: Normal heart rate noted  Respiratory: Normal respiratory effort, no problems with respiration noted  Abdomen: Soft, gravid, appropriate for gestational age.  Pain/Pressure: Absent     Pelvic: Cervical exam deferred        Extremities: Normal range of motion.  Edema: None  Mental Status: Normal mood and affect. Normal behavior. Normal judgment and thought content.   Assessment and Plan:  Pregnancy: G2P1001 at [redacted]w[redacted]d  1. Supervision of other normal pregnancy, antepartum C/o fatigue. Last day of work was 06/24/21 and is now on maternity leave 1 hour glucola on 05/30/21=72 - Hemoglobin, fingerstick  2. Anemia during pregnancy in third trimester Not taking FeSo4 TID because has a hard time swallowing; taking Fe 1-2x/day maybe To try ferrous gluconate TID; if no improvement then discussed heme consult  with Dr. Fernand Parkins pica--discussed potential implications to self and fetus - Hemoglobin, fingerstick  3. Obesity without serious comorbidity, unspecified classification, unspecified obesity type Not taking ASA daily 33 lb (15 kg) Not walking due to fatigue   4. Positive urine drug screen +MJ 01/31/21 Denies use 05/16/21 UDS neg 03/28/21 UDS neg  5. Pica ice 2-3 c/day Counseled not to eat ice   Preterm labor symptoms and general obstetric precautions including but not limited to vaginal bleeding, contractions, leaking of fluid and fetal movement were reviewed in detail with the patient. Please refer to After Visit Summary for other counseling recommendations.  Return in about 2 weeks (around 07/11/2021) for routine PNC.  No future appointments.  Alberteen Spindle, CNM

## 2021-06-27 NOTE — Addendum Note (Signed)
Addended by: Burt Knack on: 06/27/2021 12:26 PM   Modules accepted: Orders

## 2021-07-12 ENCOUNTER — Ambulatory Visit: Payer: Medicaid Other | Admitting: Family Medicine

## 2021-07-12 ENCOUNTER — Other Ambulatory Visit: Payer: Self-pay

## 2021-07-12 VITALS — BP 119/78 | HR 78 | Temp 97.3°F | Wt 219.4 lb

## 2021-07-12 DIAGNOSIS — Z3480 Encounter for supervision of other normal pregnancy, unspecified trimester: Secondary | ICD-10-CM | POA: Diagnosis not present

## 2021-07-12 DIAGNOSIS — R825 Elevated urine levels of drugs, medicaments and biological substances: Secondary | ICD-10-CM

## 2021-07-12 DIAGNOSIS — O99013 Anemia complicating pregnancy, third trimester: Secondary | ICD-10-CM

## 2021-07-12 LAB — URINALYSIS
Bilirubin, UA: NEGATIVE
Glucose, UA: NEGATIVE
Ketones, UA: NEGATIVE
Nitrite, UA: NEGATIVE
RBC, UA: NEGATIVE
Specific Gravity, UA: 1.025 (ref 1.005–1.030)
Urobilinogen, Ur: 2 mg/dL — ABNORMAL HIGH (ref 0.2–1.0)
pH, UA: 7 (ref 5.0–7.5)

## 2021-07-12 NOTE — Progress Notes (Signed)
[facility]  PRENATAL VISIT NOTE  Subjective:  Janet Roach is a 25 y.o. G2P1001 at [redacted]w[redacted]d being seen today for ongoing prenatal care.  She is currently monitored for the following issues for this low-risk pregnancy and has Supervision of other normal pregnancy, antepartum; Positive urine drug screen +MJ 01/31/21; Anemia in pregnancy; Obesity BMI=32.8; and Pica ice 2-3 c/day on their problem list.  Patient reports no complaints.  Contractions: Irritability. Vag. Bleeding: None.  Movement: Present. Denies leaking of fluid/ROM.   The following portions of the patient's history were reviewed and updated as appropriate: allergies, current medications, past family history, past medical history, past social history, past surgical history and problem list. Problem list updated.  Objective:   Vitals:   07/12/21 1028  BP: 119/78  Pulse: 78  Temp: (!) 97.3 F (36.3 C)  Weight: 219 lb 6.4 oz (99.5 kg)    Fetal Status: Fetal Heart Rate (bpm): 135 Fundal Height: 33 cm Movement: Present  Presentation: Vertex  General:  Alert, oriented and cooperative. Patient is in no acute distress.  Skin: Skin is warm and dry. No rash noted.   Cardiovascular: Normal heart rate noted  Respiratory: Normal respiratory effort, no problems with respiration noted  Abdomen: Soft, gravid, appropriate for gestational age.  Pain/Pressure: Absent     Pelvic: Cervical exam deferred        Extremities: Normal range of motion.  Edema: None  Mental Status: Normal mood and affect. Normal behavior. Normal judgment and thought content.   Assessment and Plan:  Pregnancy: G2P1001 at [redacted]w[redacted]d  1. Supervision of other normal pregnancy, antepartum  -As part of routine education we reviewed s/sx of pre-eclampsia and to seek medical care ASAP if present.  -PP contraception plan is OPCs -Feeding plan is breast feeding  -Birth plans- attempt without epidural  -baby shower on 10/15 then  will see what else is needed to be baby ready.  -Anticipated guidance - next visit 36 week labs, then weekly visits until delivery.   -last U/A with +2 proteins rechecked today. Denies s/sx of preeclampsia.   - Urinalysis (Urine Dip)   2. Anemia during pregnancy in third trimester - Hgb at 10/12 appt.  -taking FeO4 TID as directed - no longer getting cups of ice be will eat ice left over in cup.   3. Positive urine drug screen +MJ 01/31/21 -3rd trimester UDS today, patient has not used marijuana since prior to initial visit.   - [id number]+Oxycodone-Bund   Preterm labor symptoms and general obstetric precautions including but not limited to vaginal bleeding, contractions, leaking of fluid and fetal movement were reviewed in detail with the patient. Please refer to After Visit Summary for other counseling recommendations.   Return in about 2 weeks (around 07/26/2021) for routine prenatal care, 36 week labs.  Future Appointments  Date Time Provider Department Center  07/26/2021 10:40 AM [facility] PROVIDER [facility] None    Wendi Snipes, FNP

## 2021-07-12 NOTE — Progress Notes (Signed)
Correctly verbalizes how to take iron and PNV. Reports for past 2 weeks taking iron TID with orange juice. Jossie Ng, RN Urine dip reviewed by Elveria Rising FNP-BC. Jossie Ng, RN

## 2021-07-14 LAB — 789231 7+OXYCODONE-BUND
Amphetamines, Urine: NEGATIVE ng/mL
BENZODIAZ UR QL: NEGATIVE ng/mL
Barbiturate screen, urine: NEGATIVE ng/mL
Cannabinoid Quant, Ur: NEGATIVE ng/mL
Cocaine (Metab.): NEGATIVE ng/mL
OPIATE SCREEN URINE: NEGATIVE ng/mL
Oxycodone/Oxymorphone, Urine: NEGATIVE ng/mL
PCP Quant, Ur: NEGATIVE ng/mL

## 2021-07-26 ENCOUNTER — Other Ambulatory Visit: Payer: Self-pay

## 2021-07-26 ENCOUNTER — Ambulatory Visit: Payer: Medicaid Other | Admitting: Family Medicine

## 2021-07-26 VITALS — BP 123/79 | HR 108 | Temp 97.7°F | Wt 223.6 lb

## 2021-07-26 DIAGNOSIS — Z3480 Encounter for supervision of other normal pregnancy, unspecified trimester: Secondary | ICD-10-CM

## 2021-07-26 DIAGNOSIS — O99013 Anemia complicating pregnancy, third trimester: Secondary | ICD-10-CM

## 2021-07-26 DIAGNOSIS — Z3483 Encounter for supervision of other normal pregnancy, third trimester: Secondary | ICD-10-CM | POA: Diagnosis not present

## 2021-07-26 NOTE — Progress Notes (Signed)
GBS and gc/chlamydia cx walked to lab by RN. Patient aware we will notify here next week on her next MH RV on results.   Hgb not collected today - patient discharged before RN realized need for hgb draw. Next MH RV will need hgb re-check. Provider made aware.   Appointment reminder card given to patient for next week.   Constipation in pregnancy recommendations sheet handed to patient today and recommendations for OTC hemorrhoids creams. Patient aware hemorrhoids will be re-evaluated during PP visit.   Floy Sabina, RN

## 2021-07-26 NOTE — Progress Notes (Signed)
Reports daily TID use of iron and daily PNV use since change to ferrous gluconate. Taking iron with juice. Declines self-collection of 36 week cx. Jossie Ng, RN

## 2021-07-26 NOTE — Progress Notes (Signed)
[facility]  PRENATAL VISIT NOTE  Subjective:  Janet Roach is a 25 y.o. G2P1001 at [redacted]w[redacted]d being seen today for ongoing prenatal care.  She is currently monitored for the following issues for this low-risk pregnancy and has Supervision of other normal pregnancy, antepartum; Positive urine drug screen +MJ 01/31/21; Anemia in pregnancy; Obesity BMI=32.8; and Pica ice 2-3 c/day on their problem list.  Patient reports  pelvic pressure .  Contractions: Irritability. Vag. Bleeding: None.  Movement: Present. Denies leaking of fluid/ROM.   The following portions of the patient's history were reviewed and updated as appropriate: allergies, current medications, past family history, past medical history, past social history, past surgical history and problem list. Problem list updated.  Objective:   Vitals:   07/26/21 1115  BP: 123/79  Pulse: (!) 108  Temp: 97.7 F (36.5 C)  Weight: 223 lb 9.6 oz (101.4 kg)    Fetal Status: Fetal Heart Rate (bpm): 141 Fundal Height: 35 cm Movement: Present  Presentation: Vertex  General:  Alert, oriented and cooperative. Patient is in no acute distress.  Skin: Skin is warm and dry. No rash noted.   Cardiovascular: Normal heart rate noted  Respiratory: Normal respiratory effort, no problems with respiration noted  Abdomen: Soft, gravid, appropriate for gestational age.  Pain/Pressure: Present     Pelvic: Cervical exam performed Dilation: 1.5 Effacement (%): 30 Station: -3  Extremities: Normal range of motion.  Edema: None  Mental Status: Normal mood and affect. Normal behavior. Normal judgment and thought content.   Assessment and Plan:  Pregnancy: G2P1001 at [redacted]w[redacted]d  1. Prenatal care, subsequent pregnancy, third trimester -taking PNV and FeSO4 as directed  -declined flu vaccine  -36 week labs today, provider collect  -discuss starting weekly visits.  -pt reports feeling more pressure in pelvis, cervical check  today.    - Culture, beta strep (group b only) - Chlamydia/GC NAA, Confirmation  2. Anemia during pregnancy in third trimester Taking FeSO4 as directed last Hgb was 8.4.  Recheck at next weeks appointment then pt needs referral for IV infusion.      Preterm labor symptoms and general obstetric precautions including but not limited to vaginal bleeding, contractions, leaking of fluid and fetal movement were reviewed in detail with the patient. Please refer to After Visit Summary for other counseling recommendations.  Return in about 1 week (around 08/02/2021) for routine prenatal care.  Future Appointments  Date Time Provider Department Center  08/02/2021 10:40 AM [facility] PROVIDER [facility] None    Wendi Snipes, FNP

## 2021-07-30 LAB — CHLAMYDIA/GC NAA, CONFIRMATION
Chlamydia trachomatis, NAA: NEGATIVE
Neisseria gonorrhoeae, NAA: NEGATIVE

## 2021-07-30 LAB — CULTURE, BETA STREP (GROUP B ONLY): Strep Gp B Culture: NEGATIVE

## 2021-08-02 ENCOUNTER — Ambulatory Visit: Payer: Medicaid Other | Admitting: Family Medicine

## 2021-08-02 ENCOUNTER — Other Ambulatory Visit: Payer: Self-pay

## 2021-08-02 VITALS — BP 126/85 | HR 95 | Temp 97.5°F | Wt 224.6 lb

## 2021-08-02 DIAGNOSIS — Z3480 Encounter for supervision of other normal pregnancy, unspecified trimester: Secondary | ICD-10-CM

## 2021-08-02 DIAGNOSIS — O99013 Anemia complicating pregnancy, third trimester: Secondary | ICD-10-CM

## 2021-08-02 LAB — HEMOGLOBIN, FINGERSTICK: Hemoglobin: 9.6 g/dL — ABNORMAL LOW (ref 11.1–15.9)

## 2021-08-02 NOTE — Progress Notes (Signed)
Hgb reviewed during clinic visit. Patient to continue taking iron TID.   Floy Sabina, RN

## 2021-08-03 ENCOUNTER — Encounter: Payer: Self-pay | Admitting: Obstetrics and Gynecology

## 2021-08-03 ENCOUNTER — Other Ambulatory Visit: Payer: Self-pay

## 2021-08-03 ENCOUNTER — Telehealth: Payer: Self-pay

## 2021-08-03 ENCOUNTER — Observation Stay
Admission: EM | Admit: 2021-08-03 | Discharge: 2021-08-03 | Disposition: A | Discharge status: Home / Self Care | Payer: Medicaid Other | Attending: Obstetrics and Gynecology | Admitting: Obstetrics and Gynecology

## 2021-08-03 DIAGNOSIS — O26893 Other specified pregnancy related conditions, third trimester: Principal | ICD-10-CM

## 2021-08-03 DIAGNOSIS — Z3A37 37 weeks gestation of pregnancy: Secondary | ICD-10-CM | POA: Insufficient documentation

## 2021-08-03 DIAGNOSIS — O99013 Anemia complicating pregnancy, third trimester: Secondary | ICD-10-CM

## 2021-08-03 DIAGNOSIS — R102 Pelvic and perineal pain: Secondary | ICD-10-CM | POA: Diagnosis not present

## 2021-08-03 NOTE — OB Triage Note (Signed)
Pt G2P1 at [redacted]w[redacted]d presents for constant pelvic pain that started last night. Pt reports +FM. Denies LOF/bleeding/CTX. Denies complications this pregnancy. VSS. Monitors applied.

## 2021-08-03 NOTE — Telephone Encounter (Addendum)
Patient called clinic stating that after she left her appointment yesterday she developed a very tight pelvic area. She states she is in pain 7/10, continuous, and that she couldn't roll over in bed or get into bed without help due to pain. Denies bleeding, leaking fluid, and states baby is moving as usual. Patient states her pain does not feel like contractions, but one long continuous painful tightness. This has been ongoing since about 7 pm last night. Per consult with provider E. Sciora, CNM, patient counseled to go to the ED to be checked out. Patient states understanding and plans to go the ED this am. .Burt Knack, RN   Consulted on the plan of care for this client.  I agree with the documented note and actions taken to provide care for this client.  Hazle Coca, CNM

## 2021-08-04 ENCOUNTER — Telehealth: Payer: Self-pay

## 2021-08-04 DIAGNOSIS — R102 Pelvic and perineal pain: Secondary | ICD-10-CM

## 2021-08-04 NOTE — Telephone Encounter (Signed)
Transition Care Management Unsuccessful Follow-up Telephone Call  Date of discharge and from where:  08/03/2021 from [facility]  Attempts:  1st Attempt  Reason for unsuccessful TCM follow-up call:  Left voice message    

## 2021-08-04 NOTE — Final Progress Note (Signed)
L&D OB Triage Note  Janet Roach is a 25 y.o. G2P1001 female at [redacted]w[redacted]d, EDD Estimated Date of Delivery: 08/19/21 who presented to triage for complaints of pelvic pain.  Rates pain 7/10.  Of note, patient receives prenatal care at [facility].  Reports that she had a prenatal visit earlier today.  She has not tried anything for her pain. She was evaluated by the nurses with no significant findings for labor. Vital signs stable. An NST was performed and has been reviewed by MD. She was treated with PO Tylenol.  Also was advised on a belly band.  Physical Exam: Blood pressure 129/82, pulse 91, temperature 98 F (36.7 C), temperature source Oral, resp. rate 16, height 5\' 9"  (1.753 m), weight 101.6 kg, last menstrual period 11/12/2020, unknown if currently breastfeeding.  Pelvic Exam deferred as patient had exam recently at her prenatal visit.  NST INTERPRETATION: Indications: rule out uterine contractions  Mode: External Baseline Rate (A): 135 bpm Variability: Moderate Accelerations: 15 x 15 Decelerations: None     Contraction Frequency (min): UI  Impression: reactive   Plan: NST performed was reviewed and was found to be reactive. She was discharged home with bleeding/labor precautions.  Continue routine prenatal care. Follow up with OB/GYN as previously scheduled.     11/14/2020, MD [facility]

## 2021-08-05 NOTE — Telephone Encounter (Signed)
Transition Care Management Unsuccessful Follow-up Telephone Call  Date of discharge and from where:  08/03/2021 from [facility]  Attempts:  2nd Attempt  Reason for unsuccessful TCM follow-up call:  Left voice message    

## 2021-08-06 NOTE — Telephone Encounter (Signed)
Transition Care Management Unsuccessful Follow-up Telephone Call  Date of discharge and from where:  08/03/2021 from [facility]  Attempts:  3rd Attempt  Reason for unsuccessful TCM follow-up call:  Unable to reach patient    

## 2021-08-06 NOTE — Progress Notes (Signed)
[facility]  PRENATAL VISIT NOTE  Subjective:  Janet Roach is a 25 y.o. G2P1001 at [redacted]w[redacted]d being seen today for ongoing prenatal care.  She is currently monitored for the following issues for this low-risk pregnancy and has Supervision of other normal pregnancy, antepartum; Positive urine drug screen +MJ 01/31/21; Anemia in pregnancy; Obesity BMI=32.8; Pica ice 2-3 c/day; Indication for care in labor and delivery, antepartum; and Pelvic pain in pregnancy, antepartum, third trimester on their problem list.  Patient reports  some lower pelvic pressure  .  Contractions: Irritability. Vag. Bleeding: None.  Movement: Present. Denies leaking of fluid/ROM.   The following portions of the patient's history were reviewed and updated as appropriate: allergies, current medications, past family history, past medical history, past social history, past surgical history and problem list. Problem list updated.  Objective:   Vitals:   08/02/21 1118  BP: 126/85  Pulse: 95  Temp: (!) 97.5 F (36.4 C)  Weight: 224 lb 9.6 oz (101.9 kg)    Fetal Status: Fetal Heart Rate (bpm): 137 Fundal Height: 37 cm Movement: Present  Presentation: Vertex  General:  Alert, oriented and cooperative. Patient is in no acute distress.  Skin: Skin is warm and dry. No rash noted.   Cardiovascular: Normal heart rate noted  Respiratory: Normal respiratory effort, no problems with respiration noted  Abdomen: Soft, gravid, appropriate for gestational age.  Pain/Pressure: Present     Pelvic: Cervical exam deferred        Extremities: Normal range of motion.  Edema: Trace  Mental Status: Normal mood and affect. Normal behavior. Normal judgment and thought content.   Assessment and Plan:  Pregnancy: G2P1001 at [redacted]w[redacted]d  1. Anemia during pregnancy in third trimester 2. Supervision of other normal pregnancy, antepartum -pt taking PNV and FeSo4 as directed.   -discussed 36 week labs, GBS  and CT/ GC negative.   -Hbg today last was 8.4 -referral sent for consult for iron infusion.  -discussed baby ready - Hemoglobin, fingerstick - Ambulatory referral to Hematology / Oncology  Term labor symptoms and general obstetric precautions including but not limited to vaginal bleeding, contractions, leaking of fluid and fetal movement were reviewed in detail with the patient. Please refer to After Visit Summary for other counseling recommendations.  Return in about 1 week (around 08/09/2021) for routine prenatal care.  Future Appointments  Date Time Provider Department Center  08/09/2021  9:40 AM [facility] PROVIDER [facility] None    Wendi Snipes, FNP

## 2021-08-09 ENCOUNTER — Other Ambulatory Visit: Payer: Self-pay

## 2021-08-09 ENCOUNTER — Ambulatory Visit: Payer: Medicaid Other | Admitting: Family Medicine

## 2021-08-09 ENCOUNTER — Telehealth: Payer: Self-pay

## 2021-08-09 VITALS — BP 126/71 | HR 88 | Temp 97.0°F | Wt 224.2 lb

## 2021-08-09 DIAGNOSIS — Z3480 Encounter for supervision of other normal pregnancy, unspecified trimester: Secondary | ICD-10-CM

## 2021-08-09 DIAGNOSIS — O99013 Anemia complicating pregnancy, third trimester: Secondary | ICD-10-CM

## 2021-08-09 NOTE — Progress Notes (Signed)
[facility]  PRENATAL VISIT NOTE  Subjective:  Janet Roach is a 25 y.o. G2P1001 at [redacted]w[redacted]d being seen today for ongoing prenatal care.  She is currently monitored for the following issues for this low-risk pregnancy and has Supervision of other normal pregnancy, antepartum; Positive urine drug screen +MJ 01/31/21; Anemia in pregnancy; Obesity BMI=32.8; Pica ice 2-3 c/day; Indication for care in labor and delivery, antepartum; and Pelvic pain in pregnancy, antepartum, third trimester on their problem list.  Patient reports  pelvis pressure .  Contractions: Irritability. Vag. Bleeding: None.  Movement: Present. Denies leaking of fluid/ROM.   The following portions of the patient's history were reviewed and updated as appropriate: allergies, current medications, past family history, past medical history, past social history, past surgical history and problem list. Problem list updated.  Objective:   Vitals:   08/09/21 1010  BP: 126/71  Pulse: 88  Temp: (!) 97 F (36.1 C)  Weight: 224 lb 3.2 oz (101.7 kg)    Fetal Status: Fetal Heart Rate (bpm): 142 Fundal Height: 38 cm Movement: Present  Presentation: Vertex  General:  Alert, oriented and cooperative. Patient is in no acute distress.  Skin: Skin is warm and dry. No rash noted.   Cardiovascular: Normal heart rate noted  Respiratory: Normal respiratory effort, no problems with respiration noted  Abdomen: Soft, gravid, appropriate for gestational age.  Pain/Pressure: Present     Pelvic: Cervical exam performed Dilation: 3 Effacement (%): 50 Station: -2  Extremities: Normal range of motion.  Edema: Trace  Mental Status: Normal mood and affect. Normal behavior. Normal judgment and thought content.   Assessment and Plan:  Pregnancy: G2P1001 at [redacted]w[redacted]d  1. Supervision of other normal pregnancy, antepartum -taking PNV and FeSo4 as directed.   -discussed ED visit of pelvic pain, belly band is  helping  -not exercising, mostly bed rest d/t pelvic pain.   -IOL paper work completed today.  -pt baby ready and knows when to contact or go to hospital.   2. Anemia during pregnancy in third trimester - Iron infusion appointment scheduled for 08/16/21 @ 3 pm.  Pt has appointment card with time and date.    Term labor symptoms and general obstetric precautions including but not limited to vaginal bleeding, contractions, leaking of fluid and fetal movement were reviewed in detail with the patient. Please refer to After Visit Summary for other counseling recommendations.  Return in about 1 week (around 08/16/2021) for routine prenatal care.  Future Appointments  Date Time Provider Department Center  08/16/2021 10:20 AM [facility] PROVIDER [facility] None  08/16/2021  3:00 PM Rickard Patience, MD [facility] None  08/16/2021  4:00 PM [facility] None    Wendi Snipes, FNP

## 2021-08-09 NOTE — Telephone Encounter (Signed)
Referral order corrected to internal referral and appt scheduled for 08/16/2021 with arrival time of 2:45 pm. Client is in clinic currently and appt reminder card given for appt. Jossie Ng, RN

## 2021-08-09 NOTE — Telephone Encounter (Signed)
Janet Rising FNP-BC electronically ordered [facility] Hematology referral 08/02/2021. As client has not yet received call regarding appt, call made this am regarding appt.  Message left on Melissa's voicemail regarding above and required information left as instructed. Clinic number provided. Jossie Ng, RN

## 2021-08-09 NOTE — Addendum Note (Signed)
Addended by: Wendi Snipes on: 08/09/2021 10:52 AM   Modules accepted: Orders

## 2021-08-09 NOTE — Progress Notes (Addendum)
Correctly verbalizes how to take PNV and iron tablet (TID). Taking iron tablets with juice. Jossie Ng, RN Client given appt reminder card for [facility] Hematology referral appt on 08/16/2021 with arrival time of 2:45 pm. Jossie Ng, RN IOL referral with snapshot pages faxed to [facility] with fax confirmation received. Jossie Ng, RN

## 2021-08-10 ENCOUNTER — Inpatient Hospital Stay: Payer: Medicaid Other

## 2021-08-10 ENCOUNTER — Encounter: Payer: Self-pay | Admitting: Oncology

## 2021-08-10 ENCOUNTER — Inpatient Hospital Stay: Payer: Medicaid Other | Attending: Oncology | Admitting: Oncology

## 2021-08-10 VITALS — BP 117/95 | HR 115 | Temp 98.7°F | Resp 18 | Wt 223.6 lb

## 2021-08-10 VITALS — BP 129/90 | HR 76 | Temp 97.6°F | Resp 20

## 2021-08-10 DIAGNOSIS — O99013 Anemia complicating pregnancy, third trimester: Secondary | ICD-10-CM

## 2021-08-10 DIAGNOSIS — D509 Iron deficiency anemia, unspecified: Secondary | ICD-10-CM

## 2021-08-10 DIAGNOSIS — D508 Other iron deficiency anemias: Secondary | ICD-10-CM

## 2021-08-10 DIAGNOSIS — E669 Obesity, unspecified: Secondary | ICD-10-CM | POA: Diagnosis not present

## 2021-08-10 DIAGNOSIS — Z862 Personal history of diseases of the blood and blood-forming organs and certain disorders involving the immune mechanism: Secondary | ICD-10-CM | POA: Insufficient documentation

## 2021-08-10 LAB — CBC
HCT: 33.4 % — ABNORMAL LOW (ref 36.0–46.0)
Hemoglobin: 10.5 g/dL — ABNORMAL LOW (ref 12.0–15.0)
MCH: 26.8 pg (ref 26.0–34.0)
MCHC: 31.4 g/dL (ref 30.0–36.0)
MCV: 85.2 fL (ref 80.0–100.0)
Platelets: 205 10*3/uL (ref 150–400)
RBC: 3.92 MIL/uL (ref 3.87–5.11)
RDW: 21.4 % — ABNORMAL HIGH (ref 11.5–15.5)
WBC: 8.3 10*3/uL (ref 4.0–10.5)
nRBC: 0 % (ref 0.0–0.2)

## 2021-08-10 LAB — FERRITIN: Ferritin: 18 ng/mL (ref 11–307)

## 2021-08-10 LAB — TSH: TSH: 1.777 u[IU]/mL (ref 0.350–4.500)

## 2021-08-10 LAB — IRON AND TIBC
Iron: 70 ug/dL (ref 28–170)
Saturation Ratios: 14 % (ref 10.4–31.8)
TIBC: 496 ug/dL — ABNORMAL HIGH (ref 250–450)
UIBC: 426 ug/dL

## 2021-08-10 LAB — VITAMIN B12: Vitamin B-12: 357 pg/mL (ref 180–914)

## 2021-08-10 MED ORDER — IRON SUCROSE 20 MG/ML IV SOLN
200.0000 mg | Freq: Once | INTRAVENOUS | Status: AC
  Administered 2021-08-10: 200 mg via INTRAVENOUS
  Filled 2021-08-10: qty 10

## 2021-08-10 MED ORDER — SODIUM CHLORIDE 0.9 % IV SOLN: 200.0000 mg | INTRAVENOUS | Status: DC

## 2021-08-10 MED ORDER — SODIUM CHLORIDE 0.9 % IV SOLN
Freq: Once | INTRAVENOUS | Status: AC
  Filled 2021-08-10: qty 250

## 2021-08-10 NOTE — Patient Instructions (Signed)

## 2021-08-10 NOTE — Progress Notes (Signed)
Hematology/Oncology Consult note [facility] Telephone:([phone] [phone]  Patient Care Team: Marjie Skiff, NP as PCP - General (Nurse Practitioner) Creig Hines, MD as Consulting Physician (Hematology and Oncology)   Name of the patient: Janet Roach  [id number]  Apr 09, 1996    Reason for referral-anemia third trimester of pregnancy   Referring physician-Jolene Harvest Dark, NP  Date of visit: 08/10/21   History of presenting illness- Patient is a 25 year old female currently at 42 weeks of gestation G2, P1 referred for anemia in pregnancy.  Most recent hemoglobin from 08/02/2021 on fingerstick was 9.6.  Last iron studies checked in August 2022 showed ferritin level of 6 and iron saturation of 5%.  She has had hemoglobinopathy evaluation about 6 months ago which showed a normal hemoglobin pattern but alpha thalassemia cannot be detected by hemoglobin fractionation.  Patient has iron deficiency with her first pregnancy as well but did not require any IV iron or blood transfusion.  Present pregnancy is coming along well without any antenatal complications.  She does report feeling fatigued and tries to take an oral iron tablet daily.  ECOG PS- 0  Pain scale- 0   Review of systems- Review of Systems  Constitutional:  Positive for malaise/fatigue. Negative for chills, fever and weight loss.  HENT:  Negative for congestion, ear discharge and nosebleeds.   Eyes:  Negative for blurred vision.  Respiratory:  Negative for cough, hemoptysis, sputum production, shortness of breath and wheezing.   Cardiovascular:  Negative for chest pain, palpitations, orthopnea and claudication.  Gastrointestinal:  Negative for abdominal pain, blood in stool, constipation, diarrhea, heartburn, melena, nausea and vomiting.  Genitourinary:  Negative for dysuria, flank pain, frequency, hematuria and urgency.  Musculoskeletal:  Negative for back pain, joint pain and  myalgias.  Skin:  Negative for rash.  Neurological:  Negative for dizziness, tingling, focal weakness, seizures, weakness and headaches.  Endo/Heme/Allergies:  Does not bruise/bleed easily.  Psychiatric/Behavioral:  Negative for depression and suicidal ideas. The patient does not have insomnia.    No Known Allergies  Patient Active Problem List   Diagnosis Date Noted   Pelvic pain in pregnancy, antepartum, third trimester    Indication for care in labor and delivery, antepartum 08/03/2021   Obesity BMI=32.8 06/27/2021   Pica ice 2-3 c/day 06/27/2021   Anemia in pregnancy 05/30/2021   Positive urine drug screen +MJ 01/31/21 03/28/2021   Supervision of other normal pregnancy, antepartum 01/31/2021     Past Medical History:  Diagnosis Date   Anemia    states hx anemia during pregnancy #1   Patient denies medical problems    Pica ice 2-3 c/day 06/27/2021     Past Surgical History:  Procedure Laterality Date   EYE SURGERY     states age 61 or 69   STRABISMUS SURGERY      Social History   Socioeconomic History   Marital status: Single    Spouse name: NA   Number of children: 1   Years of education: 13   Highest education level: Associate degree: occupational, Scientist, product/process development, or vocational program  Occupational History   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  Tobacco Use   Smoking status: Never   Smokeless tobacco: Never   Tobacco comments:    denies seconehand smoke   Vaping Use   Vaping Use: Never used  Substance and Sexual Activity   Alcohol use: Not Currently    Comment: on occasion, last use 10/2020-  [place]rown 0.5   Drug use: Not Currently    Types: Marijuana    Comment: last use- 2 yrs ago   Sexual activity: Yes    Birth control/protection: None    Comment: hx ocp- stopped 09/2020  Other Topics Concern   Not on file  Social History Narrative   Not on file   Social Determinants of Health   Financial Resource Strain: Low Risk     Difficulty of Paying Living Expenses: Not very hard  Food Insecurity: No Food Insecurity   Worried About Programme researcher, broadcasting/film/video in the Last Year: Never true   Ran Out of Food in the Last Year: Never true  Transportation Needs: No Transportation Needs   Lack of Transportation (Medical): No   Lack of Transportation (Non-Medical): No  Physical Activity: Not on file  Stress: Not on file  Social Connections: Not on file  Intimate Partner Violence: Not At Risk   Fear of Current or Ex-Partner: No   Emotionally Abused: No   Physically Abused: No   Sexually Abused: No     Family History  Problem Relation Age of Onset   Hypertension Mother    Hypertension Father    Diabetes Maternal Grandmother    Hypertension Maternal Grandmother    Dementia Maternal Grandmother    Heart disease Maternal Grandmother        states heart attack and had surgery   Miscarriages / Stillbirths Maternal Grandmother        had a stillbirth per pt   Multiple births Maternal Grandmother    Cancer Paternal Grandmother        breast   Cancer Paternal Grandfather    Bone cancer Maternal Uncle    Breast cancer Paternal Aunt    Prostate cancer Paternal Uncle    Thyroid disease Maternal Aunt    Multiple births Maternal Aunt      Current Outpatient Medications:    ferrous gluconate (FERGON) 240 (27 FE) MG tablet, Take 1 tablet (240 mg total) by mouth 3 (three) times daily with meals., Disp: 100 tablet, Rfl: 0   Prenatal Vit-Fe Fumarate-FA (MULTIVITAMIN-PRENATAL) 27-0.8 MG TABS tablet, Take 1 tablet by mouth daily at 12 noon., Disp: , Rfl:    Physical exam:  Vitals:   08/10/21 0852  BP: (!) 117/95  Pulse: (!) 115  Resp: 18  Temp: 98.7 F (37.1 C)  SpO2: 95%  Weight: 223 lb 9.6 oz (101.4 kg)   Physical Exam Cardiovascular:     Rate and Rhythm: Regular rhythm. Tachycardia present.     Heart sounds: Normal heart sounds.  Pulmonary:     Effort: Pulmonary effort is normal.     Breath sounds: Normal breath  sounds.  Abdominal:     Comments: Gravid uterus  Skin:    General: Skin is warm and dry.  Neurological:     Mental Status: She is alert and oriented to person, place, and time.       No flowsheet data found. CBC Latest Ref Rng & Units 05/30/2021  WBC 3.4 - 10.8 x10E3/uL 11.1(H)  Hemoglobin 11.1 - 15.9 g/dL 4.3(P)  Hematocrit 29.5 - 46.6 % 28.8(L)  Platelets 150 - 450 x10E3/uL 219    Assessment and plan- Patient is a 25 y.o. female referred for anemia in third trimester of pregnancy  Most recent hemoglobin was 9.6 on fingerstick and evidence of iron deficiency as per iron labs done in August 2022.I will proceed with CBC ferritin and iron studies B12 folate  and TSH at this time.  Given that there was clear evidence of iron deficiency based on her labs in August 2022 I will proceed with IV iron regardless.  She is already at 38 weeks so we will try to give her 5 doses of Venofer over the next 2 weeks if possible starting with first dose today.  Discussed risks and benefits of IV iron including all but not limited to possible risk of infusion reaction.  Patient understands and agrees to proceed as planned.  I will see her back in 3 months with CBC ferritin and iron studies   Thank you for this kind referral and the opportunity to participate in the care of this patient   Visit Diagnosis 1. Anemia affecting pregnancy in third trimester   2. Iron deficiency anemia, unspecified iron deficiency anemia type     Dr. Owens Shark, MD, MPH [facility] [phone] 08/10/2021 9:12 AM

## 2021-08-11 ENCOUNTER — Telehealth: Payer: Self-pay

## 2021-08-11 ENCOUNTER — Telehealth: Payer: Self-pay | Admitting: Oncology

## 2021-08-11 NOTE — Telephone Encounter (Signed)
Call to [facility] to ascertain if IOL appt scheduled as referral only faxed 08/09/2021. Receptionist states (after checking with nurse), that IOL referral not received. Receptionist verified fax number with Clinical research associate and same as fax number on confirmation form. Referral with snapshot pages faxed with fax confirmation received. Jossie Ng, RN

## 2021-08-11 NOTE — Telephone Encounter (Signed)
Pt can't make appt at 2pm and would like to go to [place]. Cal at [phone]

## 2021-08-15 ENCOUNTER — Inpatient Hospital Stay: Payer: Medicaid Other

## 2021-08-15 ENCOUNTER — Telehealth: Payer: Self-pay

## 2021-08-15 NOTE — Telephone Encounter (Signed)
Per message from Dr. Valentino Saxon to Elveria Rising FNP-BC, [facility] has scheduled IOL for 08/26/2021 at 0500 and client needs Covid testing 08/24/2021 at pre-admission testing. Call to client with above information and left message requesting she call to verify aware of appts (even if sees in My Chart). Jossie Ng, RN

## 2021-08-16 ENCOUNTER — Other Ambulatory Visit: Payer: Self-pay

## 2021-08-16 ENCOUNTER — Inpatient Hospital Stay: Payer: Medicaid Other | Attending: Oncology

## 2021-08-16 ENCOUNTER — Ambulatory Visit: Payer: Medicaid Other | Admitting: Family Medicine

## 2021-08-16 ENCOUNTER — Inpatient Hospital Stay: Payer: Medicaid Other

## 2021-08-16 ENCOUNTER — Inpatient Hospital Stay: Payer: Medicaid Other | Admitting: Oncology

## 2021-08-16 ENCOUNTER — Encounter: Payer: Self-pay | Admitting: Oncology

## 2021-08-16 VITALS — BP 111/78 | HR 67 | Temp 98.0°F | Resp 18

## 2021-08-16 VITALS — BP 140/82 | HR 81 | Temp 97.3°F | Wt 227.0 lb

## 2021-08-16 DIAGNOSIS — O99013 Anemia complicating pregnancy, third trimester: Secondary | ICD-10-CM | POA: Insufficient documentation

## 2021-08-16 DIAGNOSIS — R030 Elevated blood-pressure reading, without diagnosis of hypertension: Secondary | ICD-10-CM | POA: Insufficient documentation

## 2021-08-16 DIAGNOSIS — D508 Other iron deficiency anemias: Secondary | ICD-10-CM

## 2021-08-16 DIAGNOSIS — Z3480 Encounter for supervision of other normal pregnancy, unspecified trimester: Secondary | ICD-10-CM | POA: Diagnosis not present

## 2021-08-16 DIAGNOSIS — D509 Iron deficiency anemia, unspecified: Secondary | ICD-10-CM | POA: Insufficient documentation

## 2021-08-16 LAB — URINALYSIS
Bilirubin, UA: NEGATIVE
Glucose, UA: NEGATIVE
Ketones, UA: NEGATIVE
Nitrite, UA: NEGATIVE
RBC, UA: NEGATIVE
Specific Gravity, UA: 1.025 (ref 1.005–1.030)
Urobilinogen, Ur: 1 mg/dL (ref 0.2–1.0)
pH, UA: 7 (ref 5.0–7.5)

## 2021-08-16 MED ORDER — SODIUM CHLORIDE 0.9 % IV SOLN
Freq: Once | INTRAVENOUS | Status: AC
Start: 2021-08-16 — End: 2021-08-16
  Filled 2021-08-16: qty 250

## 2021-08-16 MED ORDER — SODIUM CHLORIDE 0.9 % IV SOLN: 200.0000 mg | INTRAVENOUS | Status: DC

## 2021-08-16 MED ORDER — IRON SUCROSE 20 MG/ML IV SOLN
200.0000 mg | Freq: Once | INTRAVENOUS | Status: AC
  Administered 2021-08-16: 200 mg via INTRAVENOUS
  Filled 2021-08-16: qty 10

## 2021-08-16 NOTE — Progress Notes (Signed)
[facility]  PRENATAL VISIT NOTE  Subjective:  Janet Roach is a 25 y.o. G2P1001 at [redacted]w[redacted]d being seen today for ongoing prenatal care.  She is currently monitored for the following issues for this high-risk pregnancy and has Supervision of other normal pregnancy, antepartum; Positive urine drug screen +MJ 01/31/21; Anemia in pregnancy; Obesity BMI=32.8; Pica ice 2-3 c/day; Indication for care in labor and delivery, antepartum; Pelvic pain in pregnancy, antepartum, third trimester; Iron deficiency anemia; and Elevated blood pressure reading on their problem list.  Patient reports  pelvic pressure .  Contractions: Irritability. Vag. Bleeding: None.  Movement: Present. Denies leaking of fluid/ROM.   The following portions of the patient's history were reviewed and updated as appropriate: allergies, current medications, past family history, past medical history, past social history, past surgical history and problem list. Problem list updated.  Objective:   Vitals:   08/16/21 1108  BP: 140/82  Pulse: 81  Temp: (!) 97.3 F (36.3 C)  Weight: 227 lb (103 kg)    Fetal Status: Fetal Heart Rate (bpm): 140 Fundal Height: 39 cm Movement: Present  Presentation: Vertex  General:  Alert, oriented and cooperative. Patient is in no acute distress.  Skin: Skin is warm and dry. No rash noted.   Cardiovascular: Normal heart rate noted  Respiratory: Normal respiratory effort, no problems with respiration noted  Abdomen: Soft, gravid, appropriate for gestational age.  Pain/Pressure: Present     Pelvic: Cervical exam deferred        Extremities: Normal range of motion.  Edema: None  Mental Status: Normal mood and affect. Normal behavior. Normal judgment and thought content.   Assessment and Plan:  Pregnancy: G2P1001 at [redacted]w[redacted]d  1. Supervision of other normal pregnancy, antepartum -taking PNV as directed  -discussed hospital ready    2. Anemia during  pregnancy in third trimester FeSO4 infusion this afternoon. Tolerated last infusion well and reports feeling  better after infusion.     3. Elevated blood pressure reading - initial  b/p was 140/82,  pt denies any s/sx of pre-eclampsia had patient wait about 20 min took b/p again 134/86.   -labs today, trace protein in U/A, PIH, and spot ordered.  - pt scheduled to RTC  tomorrow for b/p check.   If elevated pt will be sent to ED for induction.   - lab results pending.  - discussed with patient if any s/sx of preeclampsia that we discussed occurs to go to ED.   PIH Panel ([facility] [id number]) - Urinalysis - Protein / creatinine ratio, urine  (Spot)  Term labor symptoms and general obstetric precautions including but not limited to vaginal bleeding, contractions, leaking of fluid and fetal movement were reviewed in detail with the patient. Please refer to After Visit Summary for other counseling recommendations.  Return in about 1 week (around 08/23/2021) for routine prenatal care.  Future Appointments  Date Time Provider Department Center  08/16/2021  1:30 PM [facility] INFUSION CHAIR 1 [facility] None  08/17/2021  9:00 AM AC-MH PROVIDER AC-MAT None  08/18/2021 10:00 AM [facility] INFUSION CHAIR 2 [facility] None  08/19/2021  9:00 AM AC-MH PROVIDER AC-MAT None  08/23/2021 10:00 AM [facility] INFUSION CHAIR 1 [facility] None  08/23/2021  3:20 PM AC-MH PROVIDER AC-MAT None  08/25/2021 10:00 AM [facility] INFUSION CHAIR 3 [facility] None  11/10/2021  1:45 PM [facility] LAB [facility] None  11/14/2021  2:30 PM Creig Hines, MD [facility] None    Wendi Snipes, FNP

## 2021-08-16 NOTE — Telephone Encounter (Signed)
Patient was in clinic today and is aware of IOL and preadmit testing. Marland KitchenBurt Knack, RN

## 2021-08-17 ENCOUNTER — Ambulatory Visit: Payer: Medicaid Other

## 2021-08-17 ENCOUNTER — Ambulatory Visit: Payer: Medicaid Other | Admitting: Advanced Practice Midwife

## 2021-08-17 ENCOUNTER — Inpatient Hospital Stay: Payer: Medicaid Other

## 2021-08-17 VITALS — BP 114/77 | Temp 98.0°F | Wt 228.0 lb

## 2021-08-17 DIAGNOSIS — E669 Obesity, unspecified: Secondary | ICD-10-CM

## 2021-08-17 DIAGNOSIS — Z3480 Encounter for supervision of other normal pregnancy, unspecified trimester: Secondary | ICD-10-CM

## 2021-08-17 DIAGNOSIS — R030 Elevated blood-pressure reading, without diagnosis of hypertension: Secondary | ICD-10-CM

## 2021-08-17 DIAGNOSIS — F5089 Other specified eating disorder: Secondary | ICD-10-CM

## 2021-08-17 DIAGNOSIS — O99013 Anemia complicating pregnancy, third trimester: Secondary | ICD-10-CM

## 2021-08-17 DIAGNOSIS — D509 Iron deficiency anemia, unspecified: Secondary | ICD-10-CM

## 2021-08-17 LAB — AST+BUN+CREAT+LD+URIC A+HGB...
AST: 18 IU/L (ref 0–40)
BUN: 9 mg/dL (ref 6–20)
Creatinine, Ser: 0.65 mg/dL (ref 0.57–1.00)
Hematocrit: 34.5 % (ref 34.0–46.6)
Hemoglobin: 11 g/dL — ABNORMAL LOW (ref 11.1–15.9)
LDH: 173 IU/L (ref 119–226)
Platelets: 182 10*3/uL (ref 150–450)
Uric Acid: 5 mg/dL (ref 2.6–6.2)
eGFR: 126 mL/min/{1.73_m2} (ref >59)

## 2021-08-17 LAB — PROTEIN / CREATININE RATIO, URINE
Creatinine, Urine: 182.2 mg/dL
Protein, Ur: 24.8 mg/dL
Protein/Creat Ratio: 136 mg/g creat (ref 0–200)

## 2021-08-17 NOTE — Progress Notes (Signed)
Patient here for BP check. Had MH RV yesterday with elevated BP, iron infusion yesterday afternoon.Burt Knack, RN

## 2021-08-17 NOTE — Progress Notes (Addendum)
[facility] Department M[facility]  PRENATAL VISIT NOTE  Subjective:  Janet Roach is a 25 y.o. G2P1001 at [redacted]w[redacted]d being seen today for ongoing prenatal care.  She is currently monitored for the following issues for this high-risk pregnancy and has Supervision of other normal pregnancy, antepartum; Positive urine drug screen +MJ 01/31/21; Anemia in pregnancy; Obesity BMI=32.8; Pica ice 2-3 c/day; Indication for care in labor and delivery, antepartum; Pelvic pain in pregnancy, antepartum, third trimester; Iron deficiency anemia; and Elevated blood pressure reading 140/82 on 08/16/21 @ 39 4/7 on their problem list.  Patient reports no complaints.   .  .   . Denies leaking of fluid/ROM.   The following portions of the patient's history were reviewed and updated as appropriate: allergies, current medications, past family history, past medical history, past social history, past surgical history and problem list. Problem list updated.  Objective:   Vitals:   08/17/21 0859  BP: 114/77  Temp: 98 F (36.7 C)  Weight: 228 lb (103.4 kg)    Fetal Status:           General:  Alert, oriented and cooperative. Patient is in no acute distress.  Skin: Skin is warm and dry. No rash noted.   Cardiovascular: Normal heart rate noted  Respiratory: Normal respiratory effort, no problems with respiration noted  Abdomen: Soft, gravid, appropriate for gestational age.        Pelvic: Cervical exam deferred        Extremities: Normal range of motion.     Mental Status: Normal mood and affect. Normal behavior. Normal judgment and thought content.   Assessment and Plan:  Pregnancy: G2P1001 at [redacted]w[redacted]d  1. Iron deficiency anemia, unspecified iron deficiency anemia type Iron infusions 08/10/21, 08/16/21, 08/18/21, 08/23/21, and 08/25/21  2. Pica ice 2-3 c/day "I'm trying to stop"  3. Obesity without serious comorbidity, unspecified classification, unspecified obesity type 45 lb (20.4  kg)   4. Supervision of other normal pregnancy, antepartum Not working Knows when to go to L&D and danger signals IOL 08/26/21  5. Elevated blood pressure reading 122/79 and 114/77. Denies h/a, scotoma, epigastric pain. 1 lb wt gain since yesterday Labs not back yet from yesterday 45 lb (20.4 kg) Suggestions given.  RTC 08/19/21 for BP f/u  6. Anemia during pregnancy in third trimester Taking FeSo4   Term labor symptoms and general obstetric precautions including but not limited to vaginal bleeding, contractions, leaking of fluid and fetal movement were reviewed in detail with the patient. Please refer to After Visit Summary for other counseling recommendations.  No follow-ups on file.  Future Appointments  Date Time Provider Department Center  08/18/2021 10:00 AM [facility] INFUSION CHAIR 2 [facility] None  08/19/2021  9:00 AM AC-MH PROVIDER AC-MAT None  08/23/2021 10:00 AM [facility] INFUSION CHAIR 1 [facility] None  08/23/2021  3:20 PM AC-MH PROVIDER AC-MAT None  08/25/2021 10:00 AM [facility] INFUSION CHAIR 3 [facility] None  11/10/2021  1:45 PM [facility] LAB [facility] None  11/14/2021  2:30 PM Creig Hines, MD [facility] None    Alberteen Spindle, CNM

## 2021-08-18 ENCOUNTER — Inpatient Hospital Stay: Payer: Medicaid Other

## 2021-08-18 ENCOUNTER — Other Ambulatory Visit: Payer: Self-pay

## 2021-08-18 VITALS — BP 120/76 | HR 91 | Temp 97.0°F | Resp 18

## 2021-08-18 DIAGNOSIS — D508 Other iron deficiency anemias: Secondary | ICD-10-CM

## 2021-08-18 DIAGNOSIS — O99013 Anemia complicating pregnancy, third trimester: Secondary | ICD-10-CM | POA: Diagnosis not present

## 2021-08-18 MED ORDER — SODIUM CHLORIDE 0.9 % IV SOLN: 200.0000 mg | INTRAVENOUS | Status: DC

## 2021-08-18 MED ORDER — SODIUM CHLORIDE 0.9 % IV SOLN
Freq: Once | INTRAVENOUS | Status: AC
  Filled 2021-08-18: qty 250

## 2021-08-18 MED ORDER — IRON SUCROSE 20 MG/ML IV SOLN
200.0000 mg | Freq: Once | INTRAVENOUS | Status: AC
  Administered 2021-08-18: 200 mg via INTRAVENOUS

## 2021-08-19 ENCOUNTER — Ambulatory Visit: Payer: Medicaid Other | Admitting: Family Medicine

## 2021-08-19 ENCOUNTER — Ambulatory Visit: Payer: Medicaid Other

## 2021-08-19 VITALS — BP 103/73 | HR 93 | Temp 97.6°F | Wt 227.6 lb

## 2021-08-19 DIAGNOSIS — Z3403 Encounter for supervision of normal first pregnancy, third trimester: Secondary | ICD-10-CM

## 2021-08-19 DIAGNOSIS — R030 Elevated blood-pressure reading, without diagnosis of hypertension: Secondary | ICD-10-CM

## 2021-08-19 NOTE — Progress Notes (Signed)
Patient here at 40w 0d for blood pressure check.   BP 103/73. Pulse 93.   Provider made aware.   Patient aware of upcoming IOL on 08/26/21 and to register at [facility] ED at 0500 and made aware of following appointments:   Sch 08/23/2021 10:00 AM INFUSION 2 HR 120 min [facility] [facility] INFUSION CHAIR 1   Appointment Notes:   Venofer x 4 (patient is due 11/4)--jcs           Sch 08/23/2021  3:20 PM AC FOLLOW UP 20 min [facility]  Arrive at: [facility] AC-MH PROVIDER   Sch 08/23/2021  3:20 PM AC FOLLOW UP 20 min [facility] [facility]  Arrive at: [facility] AC-[facility]           Sch 08/24/2021  8:20 AM LAB 5 min [facility]-PRE ADM TESTING  Arrive at: IN PERSON [facility]-SCREENING  Appointment Notes:   Covid 19-a            Sch 08/25/2021 10:00 AM INFUSION 2 HR 120 min [facility] INFUSION CHAIR 3  Appointment Notes:   Venofer x 4 (patient is due 11/4)--jcs   Floy Sabina, RN

## 2021-08-19 NOTE — Progress Notes (Signed)
[facility] Department M[facility] Health [facility]  PRENATAL VISIT NOTE  Subjective:  Janet Roach is a 25 y.o. G2P1001 at [redacted]w[redacted]d being seen today for ongoing prenatal care.  She is currently monitored for the following issues for this low-risk pregnancy and has Supervision of other normal pregnancy, antepartum; Positive urine drug screen +MJ 01/31/21; Anemia in pregnancy; Obesity BMI=32.8; Pica ice 2-3 c/day; Indication for care in labor and delivery, antepartum; Pelvic pain in pregnancy, antepartum, third trimester; Iron deficiency anemia; and Elevated blood pressure reading 140/82 on 08/16/21 @ 39 4/7 on their problem list.  Patient reports no complaints.  Contractions: Irritability. Vag. Bleeding: None.  Movement: Present. Denies leaking of fluid/ROM.   The following portions of the patient's history were reviewed and updated as appropriate: allergies, current medications, past family history, past medical history, past social history, past surgical history and problem list. Problem list updated.  Objective:   Vitals:   08/19/21 0858  BP: 103/73  Pulse: 93  Temp: 97.6 F (36.4 C)  Weight: 227 lb 9.6 oz (103.2 kg)    Fetal Status: Fetal Heart Rate (bpm): 143 Fundal Height: 39 cm Movement: Present  Presentation: Vertex  General:  Alert, oriented and cooperative. Patient is in no acute distress.  Skin: Skin is warm and dry. No rash noted.   Cardiovascular: Normal heart rate noted  Respiratory: Normal respiratory effort, no problems with respiration noted  Abdomen: Soft, gravid, appropriate for gestational age.  Pain/Pressure: Absent     Pelvic: Cervical exam deferred        Extremities: Normal range of motion.  Edema: None  Mental Status: Normal mood and affect. Normal behavior. Normal judgment and thought content.   Assessment and Plan:  Pregnancy: G2P1001 at [redacted]w[redacted]d  1. Elevated blood pressure reading Blood pressure today 103/73 follow elevated b/p earlier this week.    -denies s/sx of preeclampsia  - aware of IOL date 11/11   -next appointment  for [facility] scheduuled for 08/23/21  Term labor symptoms and general obstetric precautions including but not limited to vaginal bleeding, contractions, leaking of fluid and fetal movement were reviewed in detail with the patient. Please refer to After Visit Summary for other counseling recommendations.  No follow-ups on file.  Future Appointments  Date Time Provider Department Center  08/23/2021 10:00 AM [facility] INFUSION CHAIR 1 [facility] None  08/23/2021  3:20 PM AC-MH PROVIDER AC-MAT None  08/24/2021  8:20 AM [facility]EN[facility] None  08/25/2021 10:00 AM [facility] INFUSION CHAIR 3 [facility] None  11/10/2021  1:45 PM [facility] LAB [facility] None  11/14/2021  2:30 PM Creig Hines, MD [facility] None    Wendi Snipes, FNP

## 2021-08-22 ENCOUNTER — Ambulatory Visit: Payer: Medicaid Other

## 2021-08-22 ENCOUNTER — Ambulatory Visit: Payer: Medicaid Other | Admitting: Oncology

## 2021-08-23 ENCOUNTER — Other Ambulatory Visit: Payer: Self-pay

## 2021-08-23 ENCOUNTER — Ambulatory Visit: Payer: Medicaid Other | Admitting: Family Medicine

## 2021-08-23 ENCOUNTER — Inpatient Hospital Stay: Payer: Medicaid Other

## 2021-08-23 VITALS — BP 120/76 | HR 86 | Temp 96.0°F | Resp 18

## 2021-08-23 VITALS — BP 115/74 | HR 96 | Temp 97.1°F | Wt 230.6 lb

## 2021-08-23 DIAGNOSIS — Z3480 Encounter for supervision of other normal pregnancy, unspecified trimester: Secondary | ICD-10-CM

## 2021-08-23 DIAGNOSIS — D509 Iron deficiency anemia, unspecified: Secondary | ICD-10-CM

## 2021-08-23 DIAGNOSIS — D508 Other iron deficiency anemias: Secondary | ICD-10-CM

## 2021-08-23 MED ORDER — IRON SUCROSE 20 MG/ML IV SOLN
200.0000 mg | Freq: Once | INTRAVENOUS | Status: AC
  Administered 2021-08-23: 200 mg via INTRAVENOUS
  Filled 2021-08-23: qty 10

## 2021-08-23 MED ORDER — SODIUM CHLORIDE 0.9 % IV SOLN: 200.0000 mg | INTRAVENOUS | Status: DC

## 2021-08-23 MED ORDER — SODIUM CHLORIDE 0.9 % IV SOLN
Freq: Once | INTRAVENOUS | Status: AC
  Filled 2021-08-23: qty 250

## 2021-08-23 NOTE — Progress Notes (Signed)
Kept iron infusion appt this am and aware of 08/25/2021 appt in [place]. Aware of covid testing appt in am and IOL 08/26/2021 at 0500.  Jossie Ng, RN

## 2021-08-23 NOTE — Progress Notes (Signed)
[facility] M[facility] Health Clinic  PRENATAL VISIT NOTE  Subjective:  Janet Roach is a 25 y.o. G2P1001 at [redacted]w[redacted]d being seen today for ongoing prenatal care.  She is currently monitored for the following issues for this low-risk pregnancy and has Supervision of other normal pregnancy, antepartum; Positive urine drug screen +MJ 01/31/21; Anemia in pregnancy; Obesity BMI=32.8; Pica ice 2-3 c/day; Indication for care in labor and delivery, antepartum; Pelvic pain in pregnancy, antepartum, third trimester; Iron deficiency anemia; and Elevated blood pressure reading 140/82 on 08/16/21 @ 39 4/7 on their problem list.  Patient reports occasional contractions.  Contractions: Irritability. Vag. Bleeding: None.  Movement: Present. Denies leaking of fluid/ROM.   The following portions of the patient's history were reviewed and updated as appropriate: allergies, current medications, past family history, past medical history, past social history, past surgical history and problem list. Problem list updated.  Objective:   Vitals:   08/23/21 1515  BP: 115/74  Pulse: 96  Temp: (!) 97.1 F (36.2 C)  Weight: 230 lb 9.6 oz (104.6 kg)    Fetal Status: Fetal Heart Rate (bpm): 144 Fundal Height: 40 cm Movement: Present  Presentation: Vertex  General:  Alert, oriented and cooperative. Patient is in no acute distress.  Skin: Skin is warm and dry. No rash noted.   Cardiovascular: Normal heart rate noted  Respiratory: Normal respiratory effort, no problems with respiration noted  Abdomen: Soft, gravid, appropriate for gestational age.  Pain/Pressure: Present     Pelvic: Cervical exam performed Dilation: 2 Effacement (%): 50 Station: -2  Extremities: Normal range of motion.  Edema: None  Mental Status: Normal mood and affect. Normal behavior. Normal judgment and thought content.   Assessment and Plan:  Pregnancy: G2P1001 at [redacted]w[redacted]d  1. Supervision of other normal pregnancy, antepartum -  taking PNV as directed  -reports contractions from 7 pm to about 7 am ' -pt aware of appointments scheduled for covid testing, iron infusion and IOL.   -Discussed membrane sweeping today. Reviewed cochrane review data on membrane sweeping at 39 wks and then at EDD. Reviewed risk of cramping, contractions, bleeding and ROM. Answered patient questions and she agreed to proceed with procedure.  -discussed PP visit ~6-12 weeks after delivery    2. Iron deficiency anemia, unspecified iron deficiency anemia type Iron infusion today, pt tolerated well.     Term labor symptoms and general obstetric precautions including but not limited to vaginal bleeding, contractions, leaking of fluid and fetal movement were reviewed in detail with the patient. Please refer to After Visit Summary for other counseling recommendations.  Return in about 8 weeks (around 10/18/2021) for postpartum.  Future Appointments  Date Time Provider Department Center  08/24/2021  8:20 AM [facility] None  08/25/2021 10:00 AM [facility] INFUSION CHAIR 3 [facility] None  11/10/2021  1:45 PM [facility] None  11/14/2021  2:30 PM Creig Hines, MD [facility] None    Wendi Snipes, FNP

## 2021-08-24 ENCOUNTER — Other Ambulatory Visit: Payer: Self-pay

## 2021-08-24 ENCOUNTER — Inpatient Hospital Stay: Payer: Medicaid Other | Admitting: Anesthesiology

## 2021-08-24 ENCOUNTER — Encounter: Payer: Self-pay | Admitting: Oncology

## 2021-08-24 ENCOUNTER — Other Ambulatory Visit: Admission: RE | Admit: 2021-08-24 | Payer: Medicaid Other | Source: Ambulatory Visit

## 2021-08-24 ENCOUNTER — Encounter: Payer: Self-pay | Admitting: Obstetrics and Gynecology

## 2021-08-24 ENCOUNTER — Inpatient Hospital Stay: Payer: Medicaid Other

## 2021-08-24 ENCOUNTER — Inpatient Hospital Stay
Admission: EM | Admit: 2021-08-24 | Discharge: 2021-08-25 | DRG: 807 | Disposition: A | Discharge status: Home / Self Care | Payer: Medicaid Other | Attending: Obstetrics and Gynecology | Admitting: Obstetrics and Gynecology

## 2021-08-24 DIAGNOSIS — D509 Iron deficiency anemia, unspecified: Secondary | ICD-10-CM | POA: Diagnosis not present

## 2021-08-24 DIAGNOSIS — O99214 Obesity complicating childbirth: Secondary | ICD-10-CM | POA: Diagnosis present

## 2021-08-24 DIAGNOSIS — O480 Post-term pregnancy: Secondary | ICD-10-CM | POA: Diagnosis not present

## 2021-08-24 DIAGNOSIS — Z3A40 40 weeks gestation of pregnancy: Secondary | ICD-10-CM | POA: Diagnosis not present

## 2021-08-24 DIAGNOSIS — Z20822 Contact with and (suspected) exposure to covid-19: Secondary | ICD-10-CM | POA: Diagnosis present

## 2021-08-24 DIAGNOSIS — Z3483 Encounter for supervision of other normal pregnancy, third trimester: Secondary | ICD-10-CM

## 2021-08-24 DIAGNOSIS — O9902 Anemia complicating childbirth: Secondary | ICD-10-CM | POA: Diagnosis not present

## 2021-08-24 DIAGNOSIS — O99013 Anemia complicating pregnancy, third trimester: Secondary | ICD-10-CM

## 2021-08-24 DIAGNOSIS — O26893 Other specified pregnancy related conditions, third trimester: Secondary | ICD-10-CM | POA: Diagnosis not present

## 2021-08-24 LAB — CBC
HCT: 37.6 % (ref 36.0–46.0)
Hemoglobin: 12 g/dL (ref 12.0–15.0)
MCH: 27.6 pg (ref 26.0–34.0)
MCHC: 31.9 g/dL (ref 30.0–36.0)
MCV: 86.4 fL (ref 80.0–100.0)
Platelets: 178 10*3/uL (ref 150–400)
RBC: 4.35 MIL/uL (ref 3.87–5.11)
RDW: 22.9 % — ABNORMAL HIGH (ref 11.5–15.5)
WBC: 11.6 10*3/uL — ABNORMAL HIGH (ref 4.0–10.5)
nRBC: 0 % (ref 0.0–0.2)

## 2021-08-24 LAB — RESP PANEL BY RT-PCR (FLU A&B, COVID) ARPGX2
Influenza A by PCR: NEGATIVE
Influenza B by PCR: NEGATIVE
SARS Coronavirus 2 by RT PCR: NEGATIVE

## 2021-08-24 LAB — URINE DRUG SCREEN, QUALITATIVE (ARMC ONLY)
Amphetamines, Ur Screen: NOT DETECTED
Barbiturates, Ur Screen: NOT DETECTED
Benzodiazepine, Ur Scrn: NOT DETECTED
Cannabinoid 50 Ng, Ur ~~LOC~~: NOT DETECTED
Cocaine Metabolite,Ur ~~LOC~~: NOT DETECTED
MDMA (Ecstasy)Ur Screen: NOT DETECTED
Methadone Scn, Ur: NOT DETECTED
Opiate, Ur Screen: NOT DETECTED
Phencyclidine (PCP) Ur S: NOT DETECTED
Tricyclic, Ur Screen: NOT DETECTED

## 2021-08-24 LAB — TYPE AND SCREEN
ABO/RH(D): A POS
Antibody Screen: NEGATIVE

## 2021-08-24 MED ORDER — OXYCODONE-ACETAMINOPHEN 5-325 MG PO TABS: 1.0000 | ORAL_TABLET | ORAL | Status: DC | PRN

## 2021-08-24 MED ORDER — BUTORPHANOL TARTRATE 1 MG/ML IJ SOLN: 1.0000 mg | INTRAMUSCULAR | Status: DC | PRN

## 2021-08-24 MED ORDER — DIPHENHYDRAMINE HCL 50 MG/ML IJ SOLN: 12.5000 mg | INTRAMUSCULAR | Status: DC | PRN

## 2021-08-24 MED ORDER — LACTATED RINGERS IV SOLN: 500.0000 mL | INTRAVENOUS | Status: DC | PRN

## 2021-08-24 MED ORDER — LIDOCAINE HCL (PF) 1 % IJ SOLN
30.0000 mL | INTRAMUSCULAR | Status: DC | PRN
  Filled 2021-08-24: qty 30

## 2021-08-24 MED ORDER — ACETAMINOPHEN 325 MG PO TABS
650.0000 mg | ORAL_TABLET | ORAL | Status: DC | PRN
  Administered 2021-08-24: 650 mg via ORAL
  Filled 2021-08-24 (×2): qty 2

## 2021-08-24 MED ORDER — IBUPROFEN 600 MG PO TABS
600.0000 mg | ORAL_TABLET | Freq: Four times a day (QID) | ORAL | Status: DC
  Administered 2021-08-24 – 2021-08-25 (×4): 600 mg via ORAL
  Filled 2021-08-24 (×4): qty 1

## 2021-08-24 MED ORDER — ACETAMINOPHEN 325 MG PO TABS
650.0000 mg | ORAL_TABLET | ORAL | Status: DC | PRN
  Administered 2021-08-24: 650 mg via ORAL

## 2021-08-24 MED ORDER — SIMETHICONE 80 MG PO CHEW: 80.0000 mg | CHEWABLE_TABLET | ORAL | Status: DC | PRN

## 2021-08-24 MED ORDER — OXYTOCIN BOLUS FROM INFUSION: 333.0000 mL | Freq: Once | INTRAVENOUS | Status: DC

## 2021-08-24 MED ORDER — OXYTOCIN-SODIUM CHLORIDE 30-0.9 UT/500ML-% IV SOLN
2.5000 [IU]/h | INTRAVENOUS | Status: DC
  Filled 2021-08-24: qty 500

## 2021-08-24 MED ORDER — EPHEDRINE 5 MG/ML INJ
10.0000 mg | INTRAVENOUS | Status: DC | PRN
  Filled 2021-08-24: qty 2

## 2021-08-24 MED ORDER — SODIUM CHLORIDE 0.9 % IV SOLN
1.0000 g | INTRAVENOUS | Status: DC
  Filled 2021-08-24 (×4): qty 1000

## 2021-08-24 MED ORDER — PHENYLEPHRINE 40 MCG/ML (10ML) SYRINGE FOR IV PUSH (FOR BLOOD PRESSURE SUPPORT)
80.0000 ug | PREFILLED_SYRINGE | INTRAVENOUS | Status: DC | PRN
  Filled 2021-08-24: qty 10

## 2021-08-24 MED ORDER — AMMONIA AROMATIC IN INHA
RESPIRATORY_TRACT | Status: AC
  Filled 2021-08-24: qty 10

## 2021-08-24 MED ORDER — DOCUSATE SODIUM 100 MG PO CAPS
100.0000 mg | ORAL_CAPSULE | Freq: Two times a day (BID) | ORAL | Status: DC
  Administered 2021-08-24 – 2021-08-25 (×2): 100 mg via ORAL
  Filled 2021-08-24 (×2): qty 1

## 2021-08-24 MED ORDER — BUPIVACAINE HCL (PF) 0.25 % IJ SOLN
INTRAMUSCULAR | Status: DC | PRN
  Administered 2021-08-24 (×2): 4 mL via EPIDURAL

## 2021-08-24 MED ORDER — LACTATED RINGERS IV SOLN
500.0000 mL | Freq: Once | INTRAVENOUS | Status: AC
  Administered 2021-08-24: 500 mL via INTRAVENOUS

## 2021-08-24 MED ORDER — SOD CITRATE-CITRIC ACID 500-334 MG/5ML PO SOLN: 30.0000 mL | ORAL | Status: DC | PRN

## 2021-08-24 MED ORDER — OXYCODONE-ACETAMINOPHEN 5-325 MG PO TABS: 2.0000 | ORAL_TABLET | ORAL | Status: DC | PRN

## 2021-08-24 MED ORDER — ONDANSETRON HCL 4 MG/2ML IJ SOLN: 4.0000 mg | Freq: Four times a day (QID) | INTRAMUSCULAR | Status: DC | PRN

## 2021-08-24 MED ORDER — OXYTOCIN-SODIUM CHLORIDE 30-0.9 UT/500ML-% IV SOLN
2.5000 [IU]/h | INTRAVENOUS | Status: DC
  Administered 2021-08-24: 2.5 [IU]/h via INTRAVENOUS

## 2021-08-24 MED ORDER — FENTANYL-BUPIVACAINE-NACL 0.5-0.125-0.9 MG/250ML-% EP SOLN
EPIDURAL | Status: AC
  Filled 2021-08-24: qty 250

## 2021-08-24 MED ORDER — OXYTOCIN-SODIUM CHLORIDE 30-0.9 UT/500ML-% IV SOLN
2.5000 [IU]/h | INTRAVENOUS | Status: DC | PRN
  Filled 2021-08-24: qty 500

## 2021-08-24 MED ORDER — ACETAMINOPHEN 325 MG PO TABS: 650.0000 mg | ORAL_TABLET | ORAL | Status: DC | PRN

## 2021-08-24 MED ORDER — LIDOCAINE-EPINEPHRINE (PF) 1.5 %-1:200000 IJ SOLN
INTRAMUSCULAR | Status: DC | PRN
Start: 2021-08-24 — End: 2021-08-24
  Administered 2021-08-24: 3 mL via PERINEURAL

## 2021-08-24 MED ORDER — DIPHENHYDRAMINE HCL 25 MG PO CAPS: 25.0000 mg | ORAL_CAPSULE | Freq: Four times a day (QID) | ORAL | Status: DC | PRN

## 2021-08-24 MED ORDER — MISOPROSTOL 200 MCG PO TABS
ORAL_TABLET | ORAL | Status: AC
  Filled 2021-08-24: qty 4

## 2021-08-24 MED ORDER — SODIUM CHLORIDE 0.9 % IV SOLN: 2.0000 g | Freq: Once | INTRAVENOUS | Status: DC

## 2021-08-24 MED ORDER — OXYTOCIN 10 UNIT/ML IJ SOLN
INTRAMUSCULAR | Status: AC
  Filled 2021-08-24: qty 1

## 2021-08-24 MED ORDER — PRENATAL MULTIVITAMIN CH
1.0000 | ORAL_TABLET | Freq: Every day | ORAL | Status: DC
  Administered 2021-08-25: 1 via ORAL
  Filled 2021-08-24: qty 1

## 2021-08-24 MED ORDER — BENZOCAINE-MENTHOL 20-0.5 % EX AERO
1.0000 "application " | INHALATION_SPRAY | CUTANEOUS | Status: DC | PRN
  Filled 2021-08-24: qty 56

## 2021-08-24 MED ORDER — LACTATED RINGERS IV SOLN: INTRAVENOUS | Status: DC

## 2021-08-24 MED ORDER — TETANUS-DIPHTH-ACELL PERTUSSIS 5-2.5-18.5 LF-MCG/0.5 IM SUSY
0.5000 mL | PREFILLED_SYRINGE | Freq: Once | INTRAMUSCULAR | Status: DC
  Filled 2021-08-24: qty 0.5

## 2021-08-24 MED ORDER — OXYTOCIN 10 UNIT/ML IJ SOLN
INTRAMUSCULAR | Status: AC
  Filled 2021-08-24: qty 2

## 2021-08-24 MED ORDER — FENTANYL-BUPIVACAINE-NACL 0.5-0.125-0.9 MG/250ML-% EP SOLN
12.0000 mL/h | EPIDURAL | Status: DC | PRN
  Administered 2021-08-24: 12 mL/h via EPIDURAL

## 2021-08-24 MED ORDER — ZOLPIDEM TARTRATE 5 MG PO TABS: 5.0000 mg | ORAL_TABLET | Freq: Every evening | ORAL | Status: DC | PRN

## 2021-08-24 NOTE — Anesthesia Preprocedure Evaluation (Addendum)
Anesthesia Evaluation  Patient identified by MRN, date of birth, ID band Patient awake    Reviewed: Allergy & Precautions, H&P , NPO status , Patient's Chart, lab work & pertinent test results  History of Anesthesia Complications Negative for: history of anesthetic complications  Airway Mallampati: II  TM Distance: >3 FB Neck ROM: full    Dental no notable dental hx.    Pulmonary neg pulmonary ROS,    Pulmonary exam normal        Cardiovascular negative cardio ROS Normal cardiovascular exam     Neuro/Psych PSYCHIATRIC DISORDERS (Pica) Marijuana usenegative neurological ROS     GI/Hepatic negative GI ROS, Neg liver ROS,   Endo/Other  negative endocrine ROSObesity   Renal/GU negative Renal ROS  negative genitourinary   Musculoskeletal   Abdominal   Peds  Hematology  (+) Blood dyscrasia, anemia ,   Anesthesia Other Findings Past Medical History: No date: Anemia     Comment:  states hx anemia during pregnancy #1 No date: Patient denies medical problems 06/27/2021: Pica ice 2-3 c/day   Reproductive/Obstetrics (+) Pregnancy                            Anesthesia Physical Anesthesia Plan  ASA: 2  Anesthesia Plan: Epidural   Post-op Pain Management:    Induction:   PONV Risk Score and Plan:   Airway Management Planned:   Additional Equipment:   Intra-op Plan:   Post-operative Plan:   Informed Consent: I have reviewed the patients History and Physical, chart, labs and discussed the procedure including the risks, benefits and alternatives for the proposed anesthesia with the patient or authorized representative who has indicated his/her understanding and acceptance.       Plan Discussed with: Anesthesiologist  Anesthesia Plan Comments:         Anesthesia Quick Evaluation

## 2021-08-24 NOTE — H&P (Signed)
History and Physical   HPI  Janet Roach is a 25 y.o. G2P1001 at [redacted]w[redacted]d Estimated Date of Delivery: 08/19/21 who is being admitted for labor management. C/O regular contractions.   OB History  OB History  Gravida Para Term Preterm AB Living  2 1 1  0 0 1  SAB IAB Ectopic Multiple Live Births  0 0 0 0 1    # Outcome Date GA Lbr Len/2nd Weight Sex Delivery Anes PTL Lv  2 Current           1 Term 01/11/18 [redacted]w[redacted]d / 00:35 3400 g M Vag-Spont EPI  LIV     Name: Janet Roach     Apgar1: 8  Apgar5: 9    PROBLEM LIST  Pregnancy complications or risks: Patient Active Problem List   Diagnosis Date Noted   Normal labor 08/24/2021   Elevated blood pressure reading 140/82 on 08/16/21 @ 39 4/7 08/16/2021   Iron deficiency anemia 08/10/2021   Pelvic pain in pregnancy, antepartum, third trimester    Indication for care in labor and delivery, antepartum 08/03/2021   Obesity BMI=32.8 06/27/2021   Pica ice 2-3 c/day 06/27/2021   Anemia in pregnancy 05/30/2021   Positive urine drug screen +MJ 01/31/21 03/28/2021   Supervision of other normal pregnancy, antepartum 01/31/2021    Prenatal labs and studies: ABO, Rh: --/--/A POS (11/09 1103) Antibody: NEG (11/09 1103) Rubella:   RPR: Non Reactive (08/15 1433)  HBsAg: Negative (04/18 1620)  HIV:    05-01-1996-- (10/11 1150)   Past Medical History:  Diagnosis Date   Anemia    states hx anemia during pregnancy #1   Patient denies medical problems    Pica ice 2-3 c/day 06/27/2021     Past Surgical History:  Procedure Laterality Date   EYE SURGERY     states age 57 or 73   STRABISMUS SURGERY       Medications    Current Discharge Medication List     CONTINUE these medications which have NOT CHANGED   Details  ferrous gluconate (FERGON) 240 (27 FE) MG tablet Take 1 tablet (240 mg total) by mouth 3 (three) times daily with meals. Qty: 100 tablet, Refills: 0   Associated Diagnoses: Anemia during pregnancy in third  trimester    Prenatal Vit-Fe Fumarate-FA (MULTIVITAMIN-PRENATAL) 27-0.8 MG TABS tablet Take 1 tablet by mouth daily at 12 noon.         Allergies  Patient has no known allergies.  Review of Systems  Pertinent items noted in HPI and remainder of comprehensive ROS otherwise negative.  Physical Exam  LMP 11/12/2020   Lungs:  CTA B Cardio: RRR without M/R/G Abd: Soft, gravid, NT Presentation: cephalic EXT: No C/C/ 1+ Edema DTRs: 2+ B CERVIX: Dilation: 10 Dilation Complete Date: 08/24/21 Dilation Complete Time: 1436 Effacement (%): 100 Cervical Position: Middle Station: -2 Presentation: Vertex Exam by:: [email or web address] MD  See Prenatal records for more detailed PE.     FHR:  Variability: Good {> 6 bpm)  Toco: Uterine Contractions: Q 2-4 min  Test Results  Results for orders placed or performed during the hospital encounter of 08/24/21 (from the past 24 hour(s))  CBC     Status: Abnormal   Collection Time: 08/24/21 11:03 AM  Result Value Ref Range   WBC 11.6 (H) 4.0 - 10.5 K/uL   RBC 4.35 3.87 - 5.11 MIL/uL   Hemoglobin 12.0 12.0 - 15.0 g/dL   HCT 13/09/22 93.8 - 10.1 %  MCV 86.4 80.0 - 100.0 fL   MCH 27.6 26.0 - 34.0 pg   MCHC 31.9 30.0 - 36.0 g/dL   RDW 70.2 (H) 63.7 - 85.8 %   Platelets 178 150 - 400 K/uL   nRBC 0.0 0.0 - 0.2 %  Type and screen [facility]     Status: None   Collection Time: 08/24/21 11:03 AM  Result Value Ref Range   ABO/RH(D) A POS    Antibody Screen NEG    Sample Expiration      08/27/2021,2359 Performed at [facility], [address], [place], [place] [address]   Resp Panel by RT-PCR (Flu A&B, Covid) Nasopharyngeal Swab     Status: None   Collection Time: 08/24/21 11:04 AM   Specimen: Nasopharyngeal Swab; Nasopharyngeal(NP) swabs in vial transport medium  Result Value Ref Range   SARS Coronavirus 2 by RT PCR NEGATIVE NEGATIVE   Influenza A by PCR NEGATIVE NEGATIVE   Influenza B by PCR NEGATIVE  NEGATIVE   Group B Strep negative  AROM performed - mod meconium  Assessment   G2P1001 at [redacted]w[redacted]d Estimated Date of Delivery: 08/19/21  The fetus is reassuring.   Patient Active Problem List   Diagnosis Date Noted   Normal labor 08/24/2021   Elevated blood pressure reading 140/82 on 08/16/21 @ 39 4/7 08/16/2021   Iron deficiency anemia 08/10/2021   Pelvic pain in pregnancy, antepartum, third trimester    Indication for care in labor and delivery, antepartum 08/03/2021   Obesity BMI=32.8 06/27/2021   Pica ice 2-3 c/day 06/27/2021   Anemia in pregnancy 05/30/2021   Positive urine drug screen +MJ 01/31/21 03/28/2021   Supervision of other normal pregnancy, antepartum 01/31/2021    Plan  1. Admit to L&D :   2. EFM: -- Category 1 3. Stadol or Epidural if desired.   4. Admission labs  5. Expect vaginal delivery.  Elonda Husky, M.D. 08/24/2021 3:21 PM

## 2021-08-24 NOTE — OB Triage Note (Signed)
Patient presents to L&D with complaint of ctx since 5:30am. They are irregular at 1-4 minutes apart lasting 70-90 seconds.

## 2021-08-24 NOTE — Anesthesia Procedure Notes (Signed)
Epidural Patient location during procedure: OB Start time: 08/24/2021 12:03 PM End time: 08/24/2021 12:06 PM  Staffing Anesthesiologist: Reed Breech, MD Resident/CRNA: Stormy Fabian, CRNA Performed: resident/CRNA   Preanesthetic Checklist Completed: patient identified, IV checked, site marked, risks and benefits discussed, surgical consent, monitors and equipment checked, pre-op evaluation and timeout performed  Epidural Patient position: sitting Prep: ChloraPrep Patient monitoring: heart rate, continuous pulse ox and blood pressure Approach: midline Location: L3-L4 Injection technique: LOR saline  Needle:  Needle type: Tuohy  Needle gauge: 17 G Needle length: 9 cm and 9 Needle insertion depth: 7 and 7.5 cm Catheter type: closed end flexible Catheter size: 19 Gauge Catheter at skin depth: 12 cm Test dose: negative and 1.5% lidocaine with Epi 1:200 K  Assessment Sensory level: T10 Events: blood not aspirated, injection not painful, no injection resistance, no paresthesia and negative IV test  Additional Notes 1 attempt Pt. Evaluated and documentation done after procedure finished. Patient identified. Risks/Benefits/Options discussed with patient including but not limited to bleeding, infection, nerve damage, paralysis, failed block, incomplete pain control, headache, blood pressure changes, nausea, vomiting, reactions to medication both or allergic, itching and postpartum back pain. Confirmed with bedside nurse the patient's most recent platelet count. Confirmed with patient that they are not currently taking any anticoagulation, have any bleeding history or any family history of bleeding disorders. Patient expressed understanding and wished to proceed. All questions were answered. Sterile technique was used throughout the entire procedure. Please see nursing notes for vital signs. Test dose was given through epidural catheter and negative prior to continuing to dose epidural  or start infusion. Warning signs of high block given to the patient including shortness of breath, tingling/numbness in hands, complete motor block, or any concerning symptoms with instructions to call for help. Patient was given instructions on fall risk and not to get out of bed. All questions and concerns addressed with instructions to call with any issues or inadequate analgesia.    Patient tolerated the insertion well without immediate complications.Reason for block:procedure for pain

## 2021-08-25 ENCOUNTER — Ambulatory Visit: Payer: Medicaid Other

## 2021-08-25 ENCOUNTER — Encounter: Payer: Self-pay | Admitting: Oncology

## 2021-08-25 DIAGNOSIS — Z3A40 40 weeks gestation of pregnancy: Secondary | ICD-10-CM

## 2021-08-25 DIAGNOSIS — O480 Post-term pregnancy: Secondary | ICD-10-CM | POA: Diagnosis not present

## 2021-08-25 DIAGNOSIS — D509 Iron deficiency anemia, unspecified: Secondary | ICD-10-CM

## 2021-08-25 DIAGNOSIS — O9902 Anemia complicating childbirth: Secondary | ICD-10-CM | POA: Diagnosis not present

## 2021-08-25 LAB — RPR: RPR Ser Ql: NONREACTIVE

## 2021-08-25 NOTE — TOC Initial Note (Signed)
Transition of Care [facility]) - Initial/Assessment Note    Patient Details  Name: Keimora Swartout MRN: [id number] Date of Birth: Aug 20, 1996  [facility]) CM/SW Contact:    Hetty Ely, RN Phone Number: 08/25/2021, 1:39 PM  Clinical Narrative: TOCRN spoke with mother, FOB Duard Brady at the bedside holding the NB with a smile. Couple lives together with 24yo son will get married soon. Discussed positive marijuana drug screen in April 2022, MOB voices not knowing at the time she was pregnant and has not used any since. Current UDS negative MOB and FOB understands if umbilical cord results positive that CPS report will be done. MOB denies resources says she stopped using and will not use again. Both parents with great family support and voices having everything they need for NB. MOB aunt and FOB sister was in the room earlier. TOC assessment complete no barriers identified, can discharge when medically stable.                       Patient Goals and CMS Choice        Expected Discharge Plan and Services                                                Prior Living Arrangements/Services                       Activities of Daily Living Home Assistive Devices/Equipment: None ADL Screening (condition at time of admission) Patient's cognitive ability adequate to safely complete daily activities?: Yes Is the patient deaf or have difficulty hearing?: No Does the patient have difficulty seeing, even when wearing glasses/contacts?: No Does the patient have difficulty concentrating, remembering, or making decisions?: No Patient able to express need for assistance with ADLs?: Yes Does the patient have difficulty dressing or bathing?: No Independently performs ADLs?: Yes (appropriate for developmental age) Does the patient have difficulty walking or climbing stairs?: No Weakness of Legs: None Weakness of Arms/Hands: None  Permission Sought/Granted                   Emotional Assessment              Admission diagnosis:  Normal labor [O80, Z37.9] Patient Active Problem List   Diagnosis Date Noted   Normal labor 08/24/2021   Elevated blood pressure reading 140/82 on 08/16/21 @ 39 4/7 08/16/2021   Iron deficiency anemia 08/10/2021   Pelvic pain in pregnancy, antepartum, third trimester    Indication for care in labor and delivery, antepartum 08/03/2021   Obesity BMI=32.8 06/27/2021   Pica ice 2-3 c/day 06/27/2021   Anemia in pregnancy 05/30/2021   Positive urine drug screen +MJ 01/31/21 03/28/2021   Supervision of other normal pregnancy, antepartum 01/31/2021   PCP:  Marjie Skiff, NP Pharmacy:   [facility] [address]), [place] - [address] Jer[place]nn M[place]) [place] [address] Phone: [phone] Fax: [phone]  [id number] - [place], [place] - [address] [place] [address] Phone: [phone] Fax: [phone]     Social Determinants of Health (SDOH) Interventions    Readmission Risk Interventions No flowsheet data found.

## 2021-08-25 NOTE — Discharge Instructions (Addendum)

## 2021-08-25 NOTE — Discharge Summary (Signed)
Patient Name: Janet Roach DOB: [birth year] MRN: [id number]                            Discharge Summary  Date of Admission: 08/24/2021 Date of Discharge: 08/25/2021 Delivering Provider: Harlin Heys   Admitting Diagnosis: Normal labor [O80, Z37.9] at [redacted]w[redacted]d Secondary diagnosis:  Active Problems:   Normal labor    Post dates pregnancy  Mode of Delivery: normal spontaneous vaginal delivery              Discharge diagnosis: Term Pregnancy Delivered      Intrapartum Procedures: Atificial rupture of membranes   Post partum procedures:   Complications: none                     Discharge Day SOAP Note:  Progress Note - Vaginal Delivery  Janet Roach is a 25 y.o. G2P2002 now PP day 1 s/p Vaginal, Spontaneous . Delivery was uncomplicated  Subjective  The patient has the following complaints: has no unusual complaints  Pain is controlled with current medications.   Patient is urinating without difficulty.  She is ambulating well.   She desires discharge today.  Objective  Vital signs: BP (!) 105/58 (BP Location: Left Arm)   Pulse 85   Temp 98 F (36.7 C) (Oral)   Resp 16   LMP 11/12/2020   SpO2 100%   Breastfeeding Unknown   Physical Exam: Gen: NAD Fundus Fundal Tone: Firm  Lochia Amount: Small        Data Review Labs: Lab Results  Component Value Date   WBC 11.6 (H) 08/24/2021   HGB 12.0 08/24/2021   HCT 37.6 08/24/2021   MCV 86.4 08/24/2021   PLT 178 08/24/2021   CBC Latest Ref Rng & Units 08/24/2021 08/16/2021 08/10/2021  WBC 4.0 - 10.5 K/uL 11.6(H) - 8.3  Hemoglobin 12.0 - 15.0 g/dL 12.0 11.0(L) 10.5(L)  Hematocrit 36.0 - 46.0 % 37.6 34.5 33.4(L)  Platelets 150 - 400 K/uL 178 182 205   A POS  Edinburgh Score: Edinburgh Postnatal Depression Scale Screening Tool 08/25/2021  I have been able to laugh and see the funny side of things. 0  I have looked forward with enjoyment to things. 0  I have blamed myself unnecessarily when things went  wrong. 0  I have been anxious or worried for no good reason. 1  I have felt scared or panicky for no good reason. 0  Things have been getting on top of me. 1  I have been so unhappy that I have had difficulty sleeping. 0  I have felt sad or miserable. 0  I have been so unhappy that I have been crying. 0  The thought of harming myself has occurred to me. 0  Edinburgh Postnatal Depression Scale Total 2    Assessment/Plan  Active Problems:   Normal labor    Plan for discharge today.  Discharge Instructions: Per After Visit Summary. Activity: Advance as tolerated. Pelvic rest for 6 weeks.  Also refer to After Visit Summary Diet: Regular Medications: Allergies as of 08/25/2021   No Known Allergies      Medication List     TAKE these medications    ferrous gluconate 240 (27 FE) MG tablet Commonly known as: FERGON Take 1 tablet (240 mg total) by mouth 3 (three) times daily with meals.   multivitamin-prenatal 27-0.8 MG Tabs tablet Take 1 tablet by mouth daily  at 12 noon.       Outpatient follow up:   Follow-up Information     Linzie Collin, MD Follow up in 2 week(s).   Specialties: Obstetrics and Gynecology, Radiology Why: May do video visit if desired. Contact information: [address] [place] [address] [phone]                Postpartum contraception: Will discuss at first office visit post-partum  Discharged Condition: good  Discharged to: home  Newborn Data: Disposition:home with mother  Apgars: APGAR (1 MIN): 8   APGAR (5 MINS): 9   APGAR (10 MINS):    Baby Feeding: Bottle and Breast    Elonda Husky, M.D. 08/25/2021 4:08 PM

## 2021-08-25 NOTE — Progress Notes (Signed)
Patient discharged home with family.  Discharge instructions, when to follow up, and prescriptions reviewed with patient.  Patient verbalized understanding. Patient will be escorted out by auxiliary.   

## 2021-08-26 ENCOUNTER — Encounter: Payer: Self-pay | Admitting: Oncology

## 2021-08-26 ENCOUNTER — Telehealth: Payer: Self-pay

## 2021-08-26 ENCOUNTER — Inpatient Hospital Stay: Payer: Medicaid Other

## 2021-08-26 NOTE — Anesthesia Postprocedure Evaluation (Signed)
Anesthesia Post Note  Patient: Janet Roach  Procedure(s) Performed: AN AD HOC LABOR EPIDURAL  Patient location during evaluation: Mother Baby Anesthesia Type: Epidural Level of consciousness: awake and alert Pain management: pain level controlled Vital Signs Assessment: post-procedure vital signs reviewed and stable Respiratory status: spontaneous breathing, nonlabored ventilation and respiratory function stable Cardiovascular status: stable Postop Assessment: no headache, no backache, patient able to bend at knees and able to ambulate Anesthetic complications: no   No notable events documented.   Last Vitals: There were no vitals filed for this visit.  Last Pain: There were no vitals filed for this visit.               Reed Breech

## 2021-08-26 NOTE — Telephone Encounter (Signed)
Transition Care Management Unsuccessful Follow-up Telephone Call  Date of discharge and from where:  08/25/2021-[facility]  Attempts:  1st Attempt  Reason for unsuccessful TCM follow-up call:  Left voice message

## 2021-08-29 NOTE — Telephone Encounter (Signed)
Transition Care Management Unsuccessful Follow-up Telephone Call  Date of discharge and from where:  08/25/2021 from [facility]  Attempts:  2nd Attempt  Reason for unsuccessful TCM follow-up call:  Left voice message

## 2021-08-30 NOTE — Telephone Encounter (Signed)
Transition Care Management Unsuccessful Follow-up Telephone Call  Date of discharge and from where:  08/25/2021 from [facility]  Attempts:  3rd Attempt  Reason for unsuccessful TCM follow-up call:  Unable to reach patient

## 2021-09-07 ENCOUNTER — Encounter: Payer: Self-pay | Admitting: Obstetrics and Gynecology

## 2021-09-07 ENCOUNTER — Telehealth (INDEPENDENT_AMBULATORY_CARE_PROVIDER_SITE_OTHER): Payer: Medicaid Other | Admitting: Obstetrics and Gynecology

## 2021-09-07 ENCOUNTER — Encounter: Payer: Self-pay | Admitting: Oncology

## 2021-09-07 NOTE — Progress Notes (Signed)
HPI:      Ms. Russella Labrum is a 25 y.o. [phone] who LMP was No LMP recorded.  Subjective:   She presents today approximately 2 weeks postpartum.  She continues to breast-feed.  She reports is going well.  She says her bleeding has decreased dramatically and she is barely bleeding at all at this time.  She reports that she is working on getting her rest.    Hx: The following portions of the patient's history were reviewed and updated as appropriate:             She  has a past medical history of Anemia, Patient denies medical problems, and Pica ice 2-3 c/day (06/27/2021). She does not have any pertinent problems on file. She  has a past surgical history that includes Strabismus surgery and Eye surgery. Her family history includes Bone cancer in her maternal uncle; Breast cancer in her paternal aunt; Cancer in her paternal grandfather and paternal grandmother; Dementia in her maternal grandmother; Diabetes in her maternal grandmother; Heart disease in her maternal grandmother; Hypertension in her father, maternal grandmother, and mother; Miscarriages / Stillbirths in her maternal grandmother; Multiple births in her maternal aunt and maternal grandmother; Prostate cancer in her paternal uncle; Thyroid disease in her maternal aunt. She  reports that she has never smoked. She has never used smokeless tobacco. She reports that she does not currently use alcohol. She reports that she does not currently use drugs after having used the following drugs: Marijuana. She has a current medication list which includes the following prescription(s): ferrous gluconate and multivitamin-prenatal. She has No Known Allergies.       Review of Systems:  Review of Systems  Constitutional: Denied constitutional symptoms, night sweats, recent illness, fatigue, fever, insomnia and weight loss.  Eyes: Denied eye symptoms, eye pain, photophobia, vision change and visual disturbance.  Ears/Nose/Throat/Neck: Denied ear, nose,  throat or neck symptoms, hearing loss, nasal discharge, sinus congestion and sore throat.  Cardiovascular: Denied cardiovascular symptoms, arrhythmia, chest pain/pressure, edema, exercise intolerance, orthopnea and palpitations.  Respiratory: Denied pulmonary symptoms, asthma, pleuritic pain, productive sputum, cough, dyspnea and wheezing.  Gastrointestinal: Denied, gastro-esophageal reflux, melena, nausea and vomiting.  Genitourinary: Denied genitourinary symptoms including symptomatic vaginal discharge, pelvic relaxation issues, and urinary complaints.  Musculoskeletal: Denied musculoskeletal symptoms, stiffness, swelling, muscle weakness and myalgia.  Dermatologic: Denied dermatology symptoms, rash and scar.  Neurologic: Denied neurology symptoms, dizziness, headache, neck pain and syncope.  Psychiatric: Denied psychiatric symptoms, anxiety and depression.  Endocrine: Denied endocrine symptoms including hot flashes and night sweats.   Meds:   Current Outpatient Medications on File Prior to Visit  Medication Sig Dispense Refill   ferrous gluconate (FERGON) 240 (27 FE) MG tablet Take 1 tablet (240 mg total) by mouth 3 (three) times daily with meals. 100 tablet 0   Prenatal Vit-Fe Fumarate-FA (MULTIVITAMIN-PRENATAL) 27-0.8 MG TABS tablet Take 1 tablet by mouth daily at 12 noon.     No current facility-administered medications on file prior to visit.      Objective:     There were no vitals filed for this visit. There were no vitals filed for this visit.                      Assessment:    G2P2002 Patient Active Problem List   Diagnosis Date Noted   Normal labor 08/24/2021   Elevated blood pressure reading 140/82 on 08/16/21 @ 39 4/7 08/16/2021   Iron deficiency anemia 08/10/2021  Pelvic pain in pregnancy, antepartum, third trimester    Indication for care in labor and delivery, antepartum 08/03/2021   Obesity BMI=32.8 06/27/2021   Pica ice 2-3 c/day 06/27/2021   Anemia in  pregnancy 05/30/2021   Positive urine drug screen +MJ 01/31/21 03/28/2021   Supervision of other normal pregnancy, antepartum 01/31/2021     1. Postpartum care and examination immediately after delivery     Patient doing excellent-postpartum.  Breast-feeding.   Plan:            1.  Follow-up in 4 weeks for postpartum exam.  2.  We have discussed birth control methods and patient has decided upon OCPs for birth control.  At her 6-week check we will discuss different types of OCPs compatible with breast-feeding. All questions answered. Orders No orders of the defined types were placed in this encounter.   No orders of the defined types were placed in this encounter.     F/U  No follow-ups on file. I spent 15 minutes involved in the care of this patient preparing to see the patient by obtaining and reviewing her medical history (including labs, imaging tests and prior procedures), documenting clinical information in the electronic health record (EHR), counseling and coordinating care plans, writing and sending prescriptions, ordering tests or procedures and in direct communicating with the patient and medical staff discussing pertinent items from her history and physical exam.  Elonda Husky, M.D. 09/07/2021 8:59 AM

## 2021-10-05 ENCOUNTER — Encounter: Payer: Medicaid Other | Admitting: Obstetrics and Gynecology

## 2021-10-26 ENCOUNTER — Telehealth: Payer: Self-pay | Admitting: Nurse Practitioner

## 2021-10-26 NOTE — Telephone Encounter (Signed)
.. °  Medicaid Managed Care   Unsuccessful Outreach Note  10/26/2021 Name: Janet Roach MRN: [id number] DOB: 07/27/1996  Referred by: Marjie Skiff, NP Reason for referral : High Risk Managed Medicaid (I called the patient today to get her scheduled with the MM Team. I left my name and number on her VM.)   An unsuccessful telephone outreach was attempted today. The patient was referred to the case management team for assistance with care management and care coordination.   Follow Up Plan: The care management team will reach out to the patient again over the next 7 days.   Weston Settle Care Guide, High Risk Medicaid Managed Care Embedded Care Coordination [facility]   [facility]

## 2021-10-27 ENCOUNTER — Telehealth: Payer: Self-pay

## 2021-10-27 NOTE — Telephone Encounter (Signed)
LVM to reschedule PP visit due to limiting staff.   No answer and LVM to return call and reschedule.   Floy Sabina, RN

## 2021-10-28 ENCOUNTER — Telehealth: Payer: Self-pay | Admitting: Family Medicine

## 2021-10-28 ENCOUNTER — Ambulatory Visit: Payer: Medicaid Other

## 2021-10-28 ENCOUNTER — Encounter: Payer: Self-pay | Admitting: Advanced Practice Midwife

## 2021-10-28 ENCOUNTER — Telehealth: Payer: Self-pay

## 2021-10-28 ENCOUNTER — Other Ambulatory Visit: Payer: Self-pay

## 2021-10-28 ENCOUNTER — Ambulatory Visit: Payer: Medicaid Other | Admitting: Advanced Practice Midwife

## 2021-10-28 VITALS — BP 99/55 | HR 68 | Temp 97.7°F | Resp 16 | Ht 69.0 in | Wt 208.0 lb

## 2021-10-28 DIAGNOSIS — O99013 Anemia complicating pregnancy, third trimester: Secondary | ICD-10-CM

## 2021-10-28 LAB — HEMOGLOBIN, FINGERSTICK: Hemoglobin: 12.8 g/dL (ref 11.1–15.9)

## 2021-10-28 MED ORDER — NORGESTIM-ETH ESTRAD TRIPHASIC 0.18/0.215/0.25 MG-25 MCG PO TABS: 1.0000 | ORAL_TABLET | Freq: Every day | ORAL | 13 refills | Status: DC

## 2021-10-28 NOTE — Patient Instructions (Signed)
Oral Contraception Information Oral contraceptive pills (OCPs) are medicines taken by mouth to prevent pregnancy. They work by: Preventing the ovaries from releasing eggs. Thickening mucus in the lower part of the uterus (cervix). This prevents sperm from entering the uterus. Thinning the lining of the uterus (endometrium). This prevents a fertilized egg from attaching to the endometrium. OCPs are highly effective when taken exactly as prescribed. However, OCPs do not prevent STIs (sexually transmitted infections). Using condoms while on an OCP can help prevent STIs. What happens before starting OCPs? Before you start taking OCPs: You may have a physical exam, blood test, and Pap test. Your health care provider will make sure you are a good candidate for oral contraception. OCPs are not a good option for certain women, such as: Women who smoke and are older than age 35. Women who have or have had certain conditions, such as: A history of high blood pressure. Deep vein thrombosis. Pulmonary embolism. Stroke. Cardiovascular disease. Peripheral vascular disease. Ask your health care provider about the possible side effects of the OCP you may be prescribed. Be aware that it can take 2-3 months for your body to adjustto changes in hormone levels. Types of oral contraception  Birth control pills contain the hormones estrogen and progestin (synthetic progesterone) or progestin only. The combination pill This type of pill contains estrogen and progestin hormones. Conventional contraception pills come in packs of 21 or 28 pills. Some packs with 28-day pills contain estrogen and progestin for the first 21-24 days. Hormone-free tablets, called placebos, are taken for the final 4-7 days. You should have menstrual bleeding during the time you take the placebos. In packs with 21 tablets, you take no pills for 7 days. Menstrual bleeding occurs during these days. (Some people prefer taking a pill for 28  days to help establish a routine). Extended-interval contraception pills come in packs of 91 pills. The first 84 tablets have both estrogen and progestin. The last 7 pills are placebos. Menstrual bleeding occurs during the placebo days. With this schedule, menstrual bleeding happens once every 3 months. Continuous contraception pills come in packs of 28 pills. All pills in the pack contain estrogen and progestin. With this schedule, regular menstrual bleeding does not happen, but there may be spotting or irregular bleeding. Progestin-only pills This type of pill is often called the mini-pill and contains the progestin hormone only. It comes in packs of 28 pills. In some packs, the last 4 pills are placebos. The pill must be taken at the same time every day. This is very important to prevent pregnancy. Menstrual bleeding may not be regular orpredictable. What are the advantages? Oral contraception provides reliable and continuous contraception if taken as directed. It may treat or decrease symptoms of: Menstrual period cramps. Irregular menstrual cycle or bleeding. Heavy menstrual flow. Abnormal uterine bleeding. Acne, depending on the type of pill. Polycystic ovarian syndrome (POS). Endometriosis. Iron deficiency anemia. Premenstrual symptoms, including severe irritability, depression, or anxiety. It also may: Reduce the risk of endometrial and ovarian cancer. Be used as emergency contraception. Prevent ectopic pregnancies and infections of the fallopian tubes. What can make OCPs less effective? OCPs may be less effective if: You forget to take the pill every day. For progestin-only pills, it is especially important to take the pill at the same time each day. Even taking it 3 hours late can increase the risk of pregnancy. You have a stomach or intestinal disease that reduces your body's ability to absorb the pill. You take   OCPs with other medicines that make OCPs less effective, such as  antibiotics, certain HIV medicines, and some seizure medicines. You take expired OCPs. You forget to restart the pill after 7 days of not taking it. This refers to the packs of 21 pills. What are the side effects and risks? OCPs can sometimes cause side effects, such as: Headache. Depression. Trouble sleeping. Nausea and vomiting. Breast tenderness. Irregular bleeding or spotting during the first several months. Bloating or fluid retention. Increase in blood pressure. Combination pills may slightly increase the risk of: Blood clots. Heart attack. Stroke. Follow these instructions at home: Follow instructions from your health care provider about how to start taking your first cycle of OCPs. Depending on when you start the pill, you may need to use a backup form of birth control, such as condoms, during the first week.Make sure you know what steps to take if you forget to take the pill. Summary Oral contraceptive pills (OCPs) are medicines taken by mouth to prevent pregnancy. They are highly effective when taken exactly as prescribed. OCPs contain a combination of the hormones estrogen and progestin (synthetic progesterone) or progestin only. Before you start taking the pill, you may have a physical exam, blood test, and Pap test. Your health care provider will make sure you are a good candidate for oral contraception. The combination pill may come in a 21-day pack, a 28-day pack, or a 91-day pack. Progestin-only pills come in packs of 28 pills. OCPs can sometimes cause side effects, such as headache, nausea, breast tenderness, or irregular bleeding. This information is not intended to replace advice given to you by your health care provider. Make sure you discuss any questions you have with your healthcare provider. Document Revised: 07/02/2020 Document Reviewed: 06/10/2020 Elsevier Patient Education  2022 Elsevier Inc.  

## 2021-10-28 NOTE — Progress Notes (Signed)
[place] Partum Exam  Janet Roach is a 26 y.o.SBF nonsmoker [id number] (59 yo son, infant) female who presents for a postpartum visit. She is 9 weeks postpartum following a spontaneous vaginal delivery on 08/24/21, F 8#0 over right labial laceration with epidural. I have fully reviewed the prenatal and intrapartum course. The delivery was at 47 5/7 gestational weeks.  Anesthesia: epidural. Postpartum course has been wnl. Baby's course has been wnl. Baby is feeding by Bottle Bleeding no bleeding. Bowel function is normal. Bladder function is normal. Patient is sexually active. Desired contraception method is Oral Contraceptive  baby weighs 13 lbs on 10/24/21 and is bottle feeding q 2 hours (4 oz). FOB and her mom help her with children. LMP 09/21/21. PP coitus x 2 (09/12/21 and 10/21/21 with condoms). Not working. Denies cigs, vaping, cigars, last MJ 01/2021. Last ETOH 01/2021. Wants ocp's   Postpartum depression screening:    The following portions of the patient's history were reviewed and updated as appropriate: allergies, current medications, past family history, past medical history, past social history, past surgical history, and problem list. Last pap smear done 01/31/21 and was Normal  Review of Systems Pertinent items are noted in HPI.    Objective:  Resp 16    Ht 5\' 9"  (1.753 m)    Wt 208 lb (94.3 kg)    BMI 30.72 kg/m   Gen: well appearing, NAD HEENT: no scleral icterus CV: RR Lung: Normal WOB Breast:performed-not indicated  Ext: warm well perfused  GU: abdomen soft without masses or tenderness External genitalia wnl Vagina with large amt white creamy leukorrhea,  Uterus NSSC Rectal: performed -  not indicated       Assessment:    9 wks postpartum exam. Pap smear not done at today's visit.   Plan:   Essential components of care per ACOG recommendations for Comprehensive Postpartum exam:  1.  Mood and well being: Patient with negative depression  screening today. Reviewed local resources for support. EPDS is low risk. Reviewed resources and that mood sx in first year after pregnancy are considered related to pregnancy and to reach out for help at [facility] if needed. Discussed [facility] as link to care and availability of LCSW for counseling  - Patient does not use tobacco.  - hx of drug use? Yes   If yes, discussed support systems in place  2. Infant care and feeding:  -Patient currently breastmilk feeding? No If breastmilk feeding discussed return to work and pumping. If needed, patient was provided letter for work to allow for every 2-3 hr pumping breaks, and to be granted a private location to express breastmilk and refrigerated area to store breastmilk. Reviewed importance of draining breast regularly to support lactation. I  -Recommended patient engage with WIC/BFpeer counselors  -Counseled to sign new child up for [facility] -Social determinants of health (SDOH) reviewed in EPIC. No concerns  3. Sexuality, contraception and birth spacing  Contraception: Contraception counseling: Reviewed all forms of birth control options in the tiered based approach. available including abstinence; over the counter/barrier methods; hormonal contraceptive medication including pill, patch, ring, injection,contraceptive implant; hormonal and nonhormonal IUDs; permanent sterilization options including vasectomy and the various tubal sterilization modalities. Risks, benefits, and typical effectiveness rates were reviewed.  Questions were answered.  Written information was also given to the patient to review.  Patient desires ocp's, this was prescribed for patient. She will follow up in  1 year for surveillance.  She was told to  call with any further questions, or with any concerns about this method of contraception.  Emphasized use of condoms 100% of the time for STI prevention.  Patient was not offered ECP. ECP was not accepted by the patient. ECP counseling was  not given - see RN documentation  - Patient does not want a pregnancy in the next year.  Desired family size is 2 children.  - Reviewed forms of contraception in tiered fashion. Patient desired oral progesterone-only contraceptive today.   - Discussed birth spacing of 18 months  4. Sleep and fatigue -Encouraged family/partner/community support of 4 hrs of uninterrupted sleep to help with mood and fatigue  5. Physical Recovery  - Discussed patients delivery and complications - Patient had a small right labial laceration degree laceration, perineal healing reviewed. Patient expressed understanding - Patient has urinary incontinence? No - Patient is safe to resume physical and sexual activity  6.  Health Maintenance/Chronic Disease Health Maintenance Due  Topic Date Due   COVID-19 Vaccine (1) Never done   INFLUENZA VACCINE  05/16/2021    - Last pap smear performed 01/31/21  and was normal   1. Anemia during pregnancy in third trimester  - Hemoglobin, fingerstick  2. Postpartum exam Tri Lo Sprintec #13 I po daily to begin tomorrow. Pt counseled to use back up condoms next 7 days. If no menses after completion of first pack and then do PT and call if +   Patient given handout about PCP care in the community Given MVI per family planning program guidelines and availability  Follow up in: 1  year  or as needed.

## 2021-10-28 NOTE — Telephone Encounter (Signed)
Client scheduled by E. Odetta Pink RN for post-partum appt this afternoon. Rich Number, RN

## 2021-10-28 NOTE — Telephone Encounter (Signed)
Pt returned the call, the message said she could be rescheduled for this afternoon but there is nothing available. She will be able to come around 3pm today. Please call her back, thanks

## 2021-10-28 NOTE — Progress Notes (Signed)
Hgb reviewed no treatment indicated.   Monica Codd, RN  

## 2021-11-02 ENCOUNTER — Other Ambulatory Visit: Payer: Self-pay | Admitting: *Deleted

## 2021-11-02 DIAGNOSIS — D509 Iron deficiency anemia, unspecified: Secondary | ICD-10-CM

## 2021-11-03 ENCOUNTER — Telehealth: Payer: Self-pay | Admitting: Nurse Practitioner

## 2021-11-03 NOTE — Telephone Encounter (Signed)
.. °  Medicaid Managed Care   Unsuccessful Outreach Note  11/03/2021 Name: Janet Roach MRN: [id number] DOB: [birth year]  Referred by: Marjie Skiff, NP Reason for referral : High Risk Managed Medicaid (I called the patient today to get her scheduled with the MM Team. I left my name and number on her VM.)   A second unsuccessful telephone outreach was attempted today. The patient was referred to the case management team for assistance with care management and care coordination.   Follow Up Plan: The care management team will reach out to the patient again over the next 7 days.    Weston Settle Care Guide, High Risk Medicaid Managed Care Embedded Care Coordination [facility]   [facility]

## 2021-11-04 ENCOUNTER — Telehealth: Payer: Self-pay | Admitting: *Deleted

## 2021-11-04 ENCOUNTER — Other Ambulatory Visit: Payer: Self-pay

## 2021-11-04 NOTE — Patient Outreach (Signed)
Care Coordination  11/04/2021  Lillyth Spong 1995-11-20 [id number]  11/04/2021 Name: Lakedra Washington MRN: [id number] DOB: [birth year]  Referred by: Marjie Skiff, NP Reason for referral : High Risk Managed Medicaid (Unsuccessful Initial RNCM telephone Outreach)   An unsuccessful telephone outreach was attempted today. The patient was referred to the case management team for assistance with care management and care coordination.    Follow Up Plan: A HIPAA compliant phone message was left for the patient providing contact information and requesting a return call. and The Managed Medicaid care management team will reach out to the patient again over the next 7 days.    Cranford Mon RN, CCM, CDCES [place]   [facility] Care Management Coordinator - Managed [place] High Risk [phone]

## 2021-11-04 NOTE — Patient Instructions (Signed)
Janet Roach ,   The [facility] Managed Care Team is available to provide assistance to you with your healthcare needs at no cost and as a benefit of your [facility] plan. I'm sorry I was unable to reach you today for our scheduled appointment. Our care guide will call you to reschedule our telephone appointment. Please call me at the number below. I am available to be of assistance to you regarding your healthcare needs. .   Thank you,   Cranford Mon RN, CCM, CDCES [place]   [facility] Care Management Coordinator - Managed [place] High Risk [phone]

## 2021-11-07 ENCOUNTER — Telehealth: Payer: Self-pay | Admitting: Nurse Practitioner

## 2021-11-07 NOTE — Telephone Encounter (Signed)
.. °  Medicaid Managed Care   Unsuccessful Outreach Note  11/07/2021 Name: Janet Roach MRN: [id number] DOB: 08/31/1996  Referred by: Marjie Skiff, NP Reason for referral : High Risk Managed Medicaid (I called the patient today to get her phone visit with the MM RNCM rescheduled. I left my name and number on her VM.)   An unsuccessful telephone outreach was attempted today. The patient was referred to the case management team for assistance with care management and care coordination.   Follow Up Plan: The care management team will reach out to the patient again over the next 7 days.    Weston Settle Care Guide, High Risk Medicaid Managed Care Embedded Care Coordination [facility]   [facility]

## 2021-11-08 ENCOUNTER — Telehealth: Payer: Self-pay | Admitting: Oncology

## 2021-11-08 NOTE — Telephone Encounter (Signed)
Sorry, Patient *WAS* scheduled for 3 month labs/MD/fu. Sent her a mychart message to confirm that she no longer wanted future appointments at the cancer center.

## 2021-11-08 NOTE — Telephone Encounter (Signed)
Pt called to cancel her appts. States that she is feeling better.

## 2021-11-10 ENCOUNTER — Inpatient Hospital Stay: Payer: Medicaid Other

## 2021-11-14 ENCOUNTER — Telehealth: Payer: Self-pay | Admitting: *Deleted

## 2021-11-14 ENCOUNTER — Telehealth: Payer: Self-pay | Admitting: Nurse Practitioner

## 2021-11-14 ENCOUNTER — Inpatient Hospital Stay: Payer: Medicaid Other | Admitting: Oncology

## 2021-11-14 NOTE — Telephone Encounter (Signed)
.. °  Medicaid Managed Care   Unsuccessful Outreach Note  11/14/2021 Name: Janet Roach MRN: [id number] DOB: July 12, 1996  Referred by: Venita Lick, NP Reason for referral : High Risk Managed Medicaid (I left my name and number on her VM to call me back and reschedule her phone visit with the MM RNCM.)   A second unsuccessful telephone outreach was attempted today. The patient was referred to the case management team for assistance with care management and care coordination.   Follow Up Plan: The care management team will reach out to the patient again over the next 7 days.    [place]

## 2021-11-14 NOTE — Patient Outreach (Signed)
Care Coordination  11/14/2021  Janet Roach 24-May-1996 [id number]  11/14/2021 Name: Janet Roach MRN: [id number] DOB: 1995-10-17  Referred by: Marjie Skiff, NP Reason for referral : Case Closure (Closing to any further HR MM telephone outreaches due to inability to contact patient)  Received message today from scheduling care guide Weston Settle of inability to contact patient to reschedule initial intake telephone assessment.  Third unsuccessful telephone outreach was attempted today. The patient was referred to the case management team for assistance with care management and care coordination. The patient's primary care provider has been notified of our unsuccessful attempts to make or maintain contact with the patient. The care management team is pleased to engage with this patient at any time in the future should he/she be interested in assistance from the care management team.    Follow Up Plan: The Managed Medicaid care management team is available to follow up with the patient after provider conversation with the patient regarding recommendation for care management engagement and subsequent re-referral to the care management team.     Cranford Mon RN, CCM, CDCES [place]   [facility] Care Management Coordinator - Managed [place] High Risk [phone]

## 2021-11-14 NOTE — Patient Instructions (Signed)
Corena Herter ,   The [facility] Managed Care Team is available to provide assistance to you with your healthcare needs at no cost and as a benefit of your [facility] plan. We have been unable to reach you on 3 separate attempts. The care management team is available to assist with your healthcare needs at any time. Please do not hesitate to contact me at the number below. .   Thank you,   Cranford Mon RN, CCM, CDCES [place]   [facility] Care Management Coordinator - Managed [place] High Risk [phone]

## 2022-03-29 IMAGING — US US OB COMP +14 WK
1 of 2 series · 13 of 28 positions shown · non-contrast
Comparison: none

Addendum:
CLINICAL DATA: Fetal anatomy evaluation

EXAM:
OBSTETRICAL ULTRASOUND >14 WKS

[Series 1: us ob comp +14 wk · 0.23mm/px · 13 of 80 slices shown]
[im 4/80]
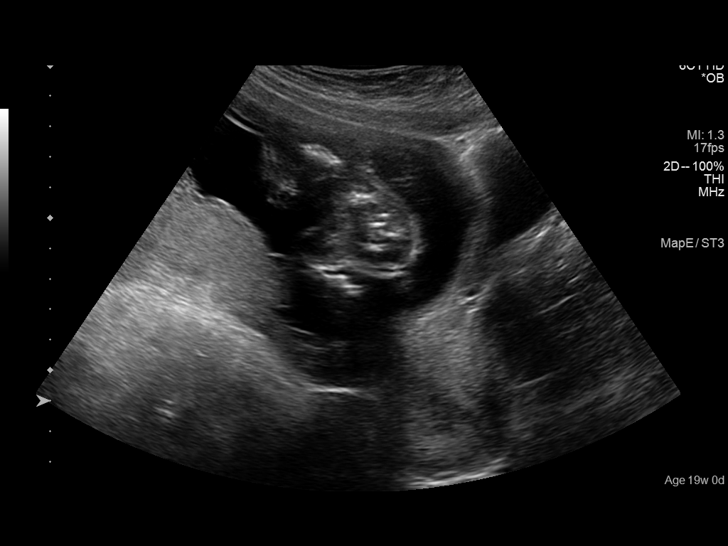
[im 10/80]
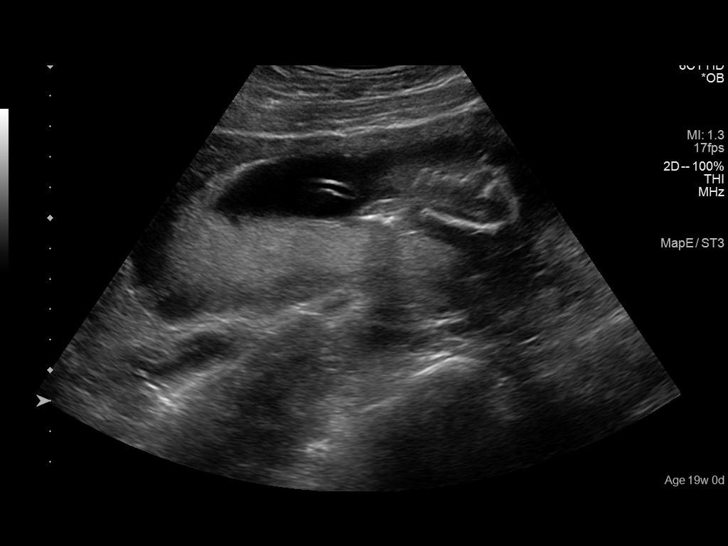
[im 16/80]
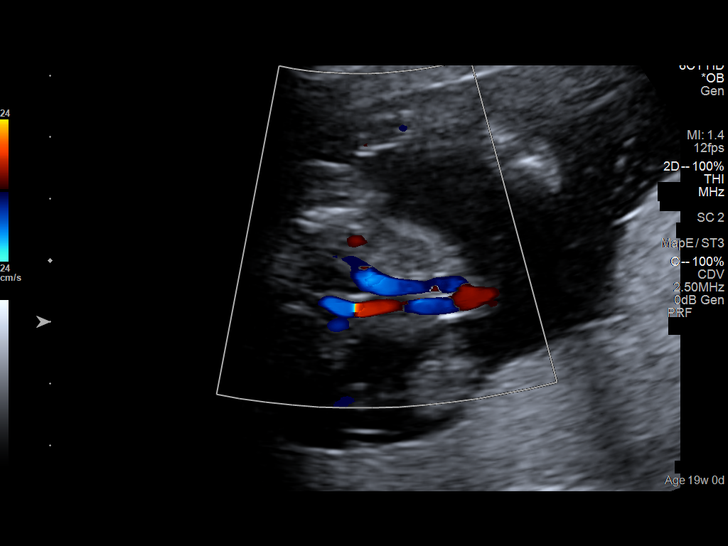
[im 22/80]
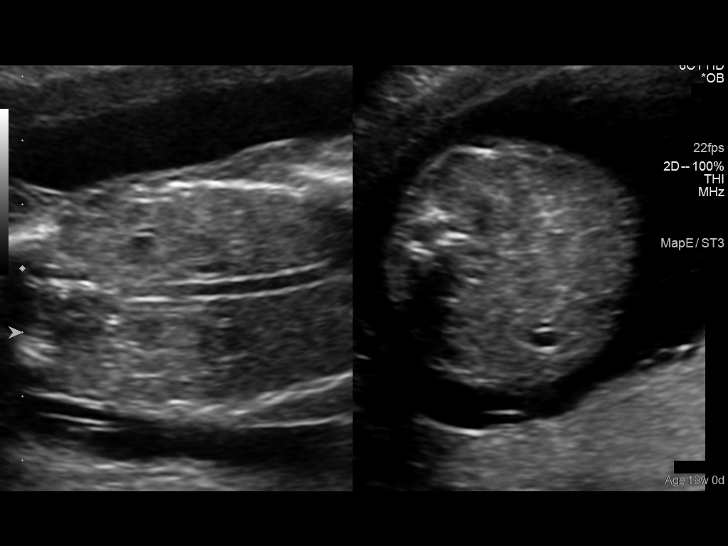
[im 28/80]
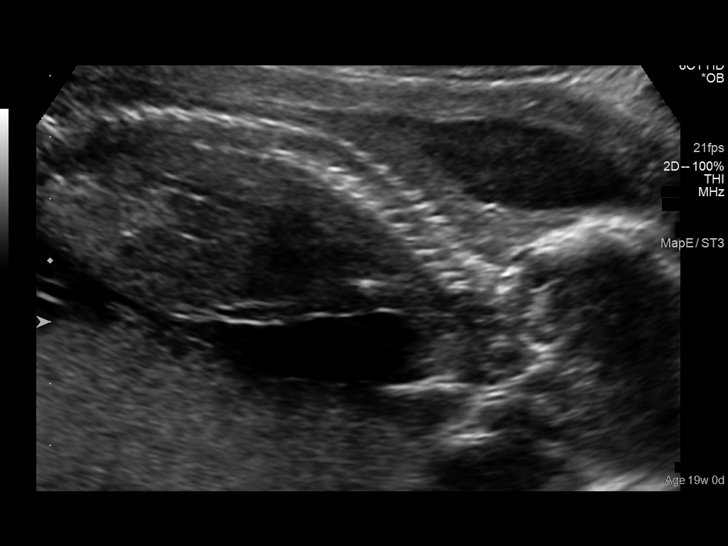
[im 34/80]
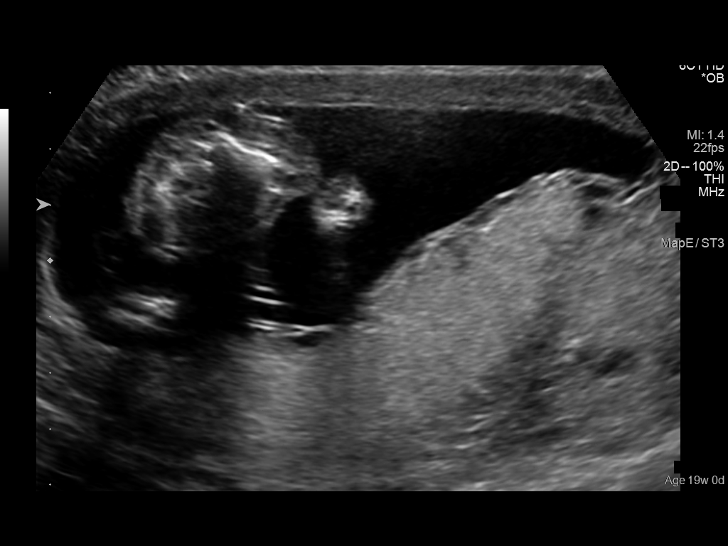
[im 43/80]
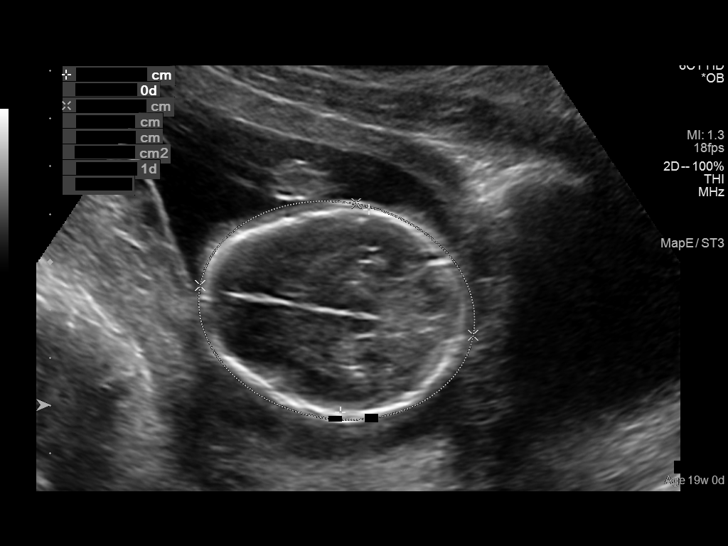
[im 49/80]
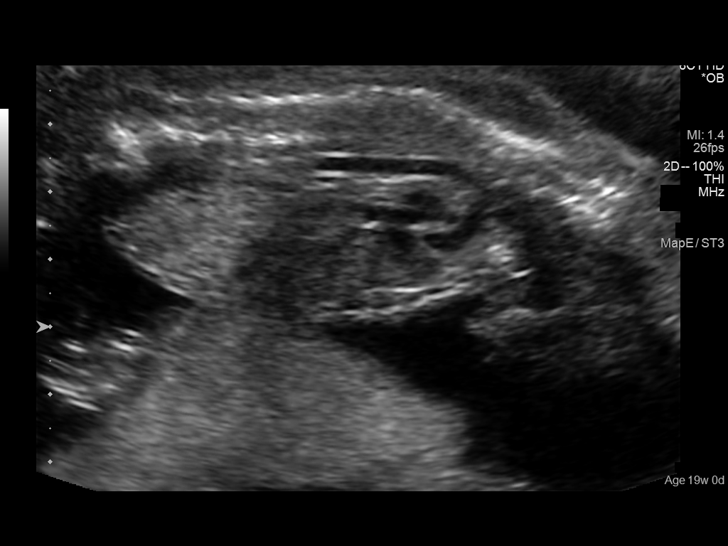
[im 55/80]
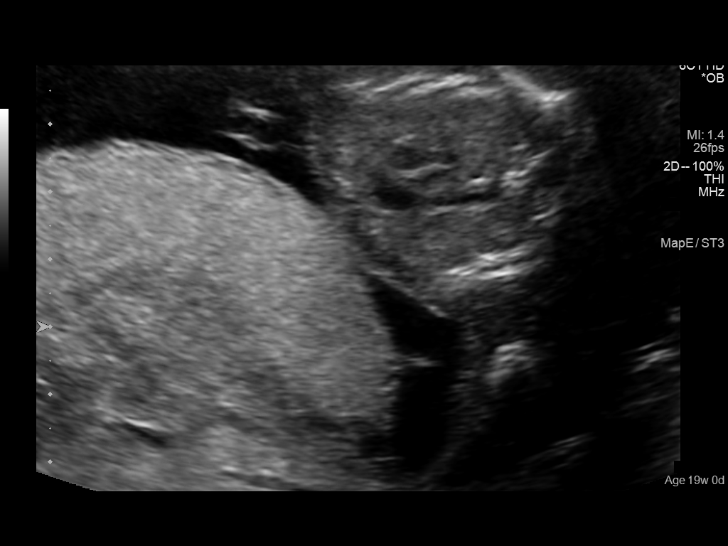
[im 61/80]
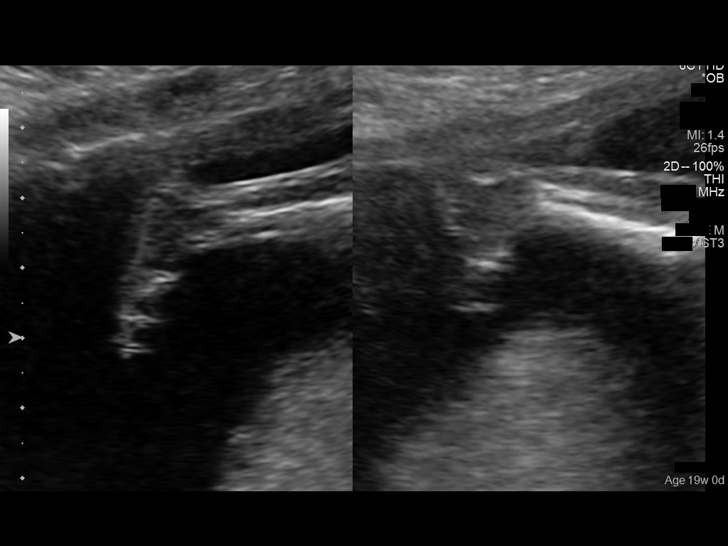
[im 67/80]
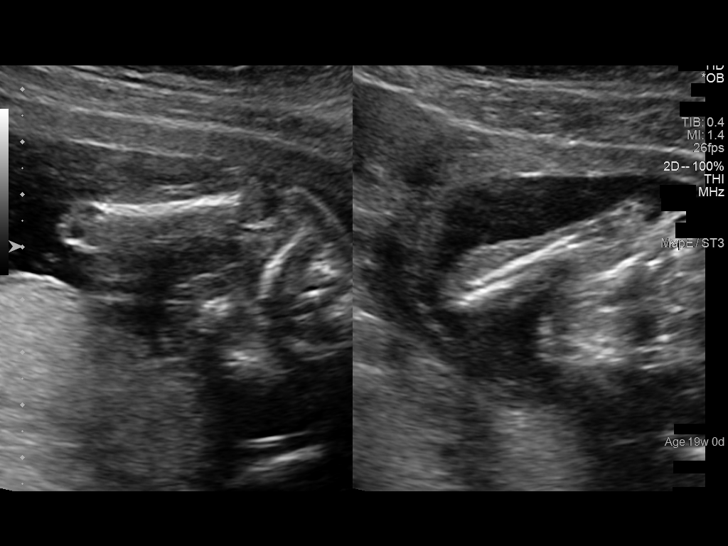
[im 73/80]
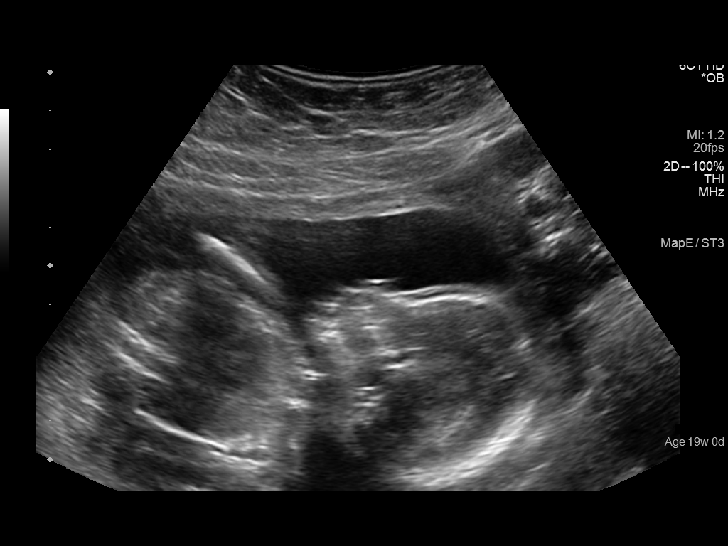
[im 80/80]
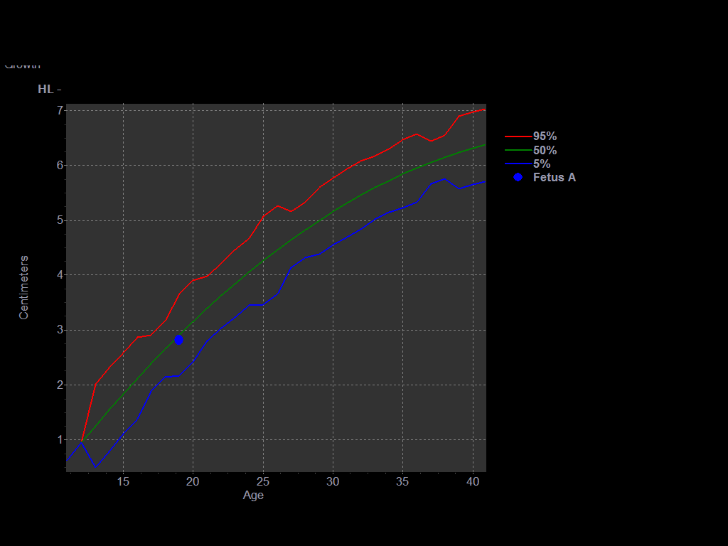

[13 of 28 positions shown; findings below may reference images not displayed]

FINDINGS: Number of Fetuses: 1

Heart Rate:  158 bpm

Movement: Yes

Presentation: Cephalic

Previa: No

Placental Location: Posterior

Amniotic Fluid (Subjective): Normal

Amniotic Fluid (Objective):

Vertical pocket = 5.3cm

FETAL BIOMETRY

BPD: 4.3cm 18w 6d

HC: 16.1cm  19w   0d

AC: 13.2cm  18w   5d

FL: 2.9cm  18w   6d

Current Mean GA: 18w 6d                     US EDC: 08/20/2021

GA by LMP:          19W 0d            Assigned EDC: 08/19/2021

FETAL ANATOMY

Lateral Ventricles: Appears normal

Thalami/CSP: Appears normal

Posterior Fossa:  Appears normal

Nuchal Region: Appears normal   NFT= 3.7 mm

Upper Lip: Appears normal

Spine: Grossly normal, but not well visualized

4 Chamber Heart on Left: Appears normal

LVOT: Appears normal

RVOT: Appears normal

Stomach on Left: Appears normal

3 Vessel Cord: Appears normal

Cord Insertion site: Appears normal

Kidneys: Appears normal

Bladder: Appears normal

Extremities: Appears normal

Sex: Male

Technically difficult due to: None

Maternal Findings:

Cervix:  Cervical length 4.2 cm.
IMPRESSION: 1. Single live intrauterine pregnancy as detailed above. No focal
fetal anatomic abnormality. Fetal spine is not well visualized.

ADDENDUM:
Typographical error in the original dictation. Correction is as
follows: Fetal sex: Female

*** End of Addendum ***
FINDINGS: Number of Fetuses: 1

Heart Rate:  158 bpm

Movement: Yes

Presentation: Cephalic

Previa: No

Placental Location: Posterior

Amniotic Fluid (Subjective): Normal

Amniotic Fluid (Objective):

Vertical pocket = 5.3cm

FETAL BIOMETRY

BPD: 4.3cm 18w 6d

HC: 16.1cm  19w   0d

AC: 13.2cm  18w   5d

FL: 2.9cm  18w   6d

Current Mean GA: 18w 6d                     US EDC: 08/20/2021

GA by LMP:          19W 0d            Assigned EDC: 08/19/2021

FETAL ANATOMY

Lateral Ventricles: Appears normal

Thalami/CSP: Appears normal

Posterior Fossa:  Appears normal

Nuchal Region: Appears normal   NFT= 3.7 mm

Upper Lip: Appears normal

Spine: Grossly normal, but not well visualized

4 Chamber Heart on Left: Appears normal

LVOT: Appears normal

RVOT: Appears normal

Stomach on Left: Appears normal

3 Vessel Cord: Appears normal

Cord Insertion site: Appears normal

Kidneys: Appears normal

Bladder: Appears normal

Extremities: Appears normal

Sex: Male

Technically difficult due to: None

Maternal Findings:

Cervix:  Cervical length 4.2 cm.
IMPRESSION: 1. Single live intrauterine pregnancy as detailed above. No focal
fetal anatomic abnormality. Fetal spine is not well visualized.

## 2022-08-16 HISTORY — PX: WISDOM TOOTH EXTRACTION: SHX21

## 2022-09-12 NOTE — Telephone Encounter (Signed)
Done

## 2022-09-27 ENCOUNTER — Encounter: Payer: Self-pay | Admitting: Oncology

## 2022-09-29 ENCOUNTER — Ambulatory Visit (LOCAL_COMMUNITY_HEALTH_CENTER): Payer: Medicaid Other

## 2022-09-29 VITALS — BP 120/83 | Ht 69.0 in | Wt 201.5 lb

## 2022-09-29 DIAGNOSIS — Z3041 Encounter for surveillance of contraceptive pills: Secondary | ICD-10-CM

## 2022-09-29 DIAGNOSIS — Z3009 Encounter for other general counseling and advice on contraception: Secondary | ICD-10-CM

## 2022-09-29 DIAGNOSIS — Z309 Encounter for contraceptive management, unspecified: Secondary | ICD-10-CM

## 2022-09-29 MED ORDER — NORGESTIM-ETH ESTRAD TRIPHASIC 0.18/0.215/0.25 MG-25 MCG PO TABS: 1.0000 | ORAL_TABLET | Freq: Every day | ORAL | 0 refills | Status: DC

## 2022-09-29 NOTE — Progress Notes (Signed)
Prescription submitted to pharmacy. Glenna Fellows, FNP   1. Surveillance of contraceptive pill  - Norgestimate-Ethinyl Estradiol Triphasic (TRI-LO-SPRINTEC) 0.18/0.215/0.25 MG-25 MCG tab; Take 1 tablet by mouth daily.  Dispense: 28 tablet; Refill: 0

## 2022-09-29 NOTE — Addendum Note (Signed)
Addended by: Glenna Fellows on: 09/29/2022 03:49 PM   Modules accepted: Orders

## 2022-09-29 NOTE — Progress Notes (Signed)
In nurse clinic requesting ocp refill for Tri Lo Sprintec.   Pt explains she is on last week of last pack of ocp. She states she has no more refills remaining at pharmacy.   Pt was prescribed Tri Lo Sprintec #13 packs at pp visit on 10/28/2021 per E Sciora, CNM. Pt has medicaid.  Annual due 10/29/2022 and reminder given.   Pt denies missing any pills and takes same time daily.  Consult A White, FNP who agrees to call in Tri Lo Sprintec #1 pack to pt's pharmacy ([facility] at [address]). Provider plans to call pharmacy this afternoon.  RN explained this to patient and suggested pt call pharmacy before picking up ocp to be sure it's ready. Questions answered and reports understanding.  Pt sent to clerk to schedule annual PE. Jerel Shepherd, RN

## 2022-10-02 NOTE — Progress Notes (Signed)
Consulted by RN re: patient situation.  Reviewed RN note and agree that it reflects our discussion and my recommendations. Trooper Olander, FNP  

## 2022-10-31 ENCOUNTER — Ambulatory Visit: Payer: Medicaid Other

## 2022-10-31 ENCOUNTER — Ambulatory Visit (LOCAL_COMMUNITY_HEALTH_CENTER): Payer: Medicaid Other

## 2022-10-31 VITALS — BP 147/66 | Ht 69.0 in | Wt 205.0 lb

## 2022-10-31 DIAGNOSIS — Z3009 Encounter for other general counseling and advice on contraception: Secondary | ICD-10-CM

## 2022-10-31 DIAGNOSIS — Z30011 Encounter for initial prescription of contraceptive pills: Secondary | ICD-10-CM

## 2022-10-31 DIAGNOSIS — Z309 Encounter for contraceptive management, unspecified: Secondary | ICD-10-CM

## 2022-10-31 DIAGNOSIS — Z3041 Encounter for surveillance of contraceptive pills: Secondary | ICD-10-CM

## 2022-10-31 MED ORDER — NORETHINDRONE 0.35 MG PO TABS
1.0000 | ORAL_TABLET | Freq: Every day | ORAL | 1 refills | Status: DC
Start: 2022-10-31 — End: 2022-12-05

## 2022-10-31 NOTE — Progress Notes (Signed)
In nurse clinic for OC refill.  States has 2 pills and then week of "sugar pills" left.  Last appt was 09/29/22 and received 1 pack of pills to cover her until her annual pe due 10/29/22.  However pt was scheduled for nurse clinic today instead of being scheduled for annual exam.  Bp 147/66 and rechecked manually 144/80.  Denies headache or visual disturbance but states mother has high BP.  Consulted Gregary Cromer FNP regarding elevated Bp and need for pills until annual exam.  See FNP note. FNP changed pill to micronor and pt will pick up 2 packs Rxed at [place].  Pt signed consent for mini pill.    Pt to report any s/s elevated Bp and will scheduled appt for return PE and Bp evaluation.  Tonny Branch, RN

## 2022-10-31 NOTE — Progress Notes (Addendum)
Patient reports to clinic today desiring birth control pills until her next physical.  BP today 147/66 with a repeat of 144/80.  Denies other signs and symptoms.  At [facility] exam on 10/28/21 BP was normal 99/55 and [facility] on 09/29/22 BP was 120/83.  Due to BP elevation will switch OCPs to Micronor.  Patient in need of an RP will given a few packs until scheduled RP, patient to have BP evaluation at that time.  Gregary Cromer, FNP   1. Family planning counseling - norethindrone (MICRONOR) 0.35 MG tablet; Take 1 tablet (0.35 mg total) by mouth daily.  Dispense: 28 tablet; Refill: 1

## 2022-12-05 ENCOUNTER — Encounter: Payer: Self-pay | Admitting: Family Medicine

## 2022-12-05 ENCOUNTER — Ambulatory Visit (LOCAL_COMMUNITY_HEALTH_CENTER): Payer: Medicaid Other | Admitting: Family Medicine

## 2022-12-05 VITALS — BP 115/74 | HR 63 | Ht 69.0 in | Wt 204.4 lb

## 2022-12-05 DIAGNOSIS — E669 Obesity, unspecified: Secondary | ICD-10-CM | POA: Insufficient documentation

## 2022-12-05 DIAGNOSIS — R030 Elevated blood-pressure reading, without diagnosis of hypertension: Secondary | ICD-10-CM

## 2022-12-05 DIAGNOSIS — Z3009 Encounter for other general counseling and advice on contraception: Secondary | ICD-10-CM | POA: Diagnosis not present

## 2022-12-05 DIAGNOSIS — Z0000 Encounter for general adult medical examination without abnormal findings: Secondary | ICD-10-CM

## 2022-12-05 DIAGNOSIS — O99210 Obesity complicating pregnancy, unspecified trimester: Secondary | ICD-10-CM | POA: Insufficient documentation

## 2022-12-05 MED ORDER — NORETHINDRONE 0.35 MG PO TABS: 1.0000 | ORAL_TABLET | Freq: Every day | ORAL | 12 refills | Status: DC

## 2022-12-05 NOTE — Progress Notes (Signed)
[facility] [place] Number: [phone]  Family Planning Visit- Repeat Yearly Visit  Subjective:  Janet Roach is a 27 y.o. G2P2002  being seen today for an annual wellness visit and to discuss contraception options.   The patient is currently using Oral Contraceptive for pregnancy prevention. Patient does not want a pregnancy in the next year.    report they are looking for a method that provides High efficacy at preventing pregnancy   Patient has the following medical problems: has Iron deficiency anemia; Elevated blood pressure reading 140/82 on 08/16/21 @ 39 4/7; and Obesity on their problem list.  Chief Complaint  Patient presents with   Contraception    P.E and birth control refills     Patient reports to clinic for PE and OCP   Patient denies concerns about self   See flowsheet for other program required questions.   Body mass index is 30.18 kg/m. - Patient is eligible for diabetes screening based on BMI> 25 and age >35?  no HA1C ordered? not applicable  Patient reports 1 of partners in last year. Desires STI screening?  No - declined   Has patient been screened once for HCV in the past?  No  No results found for: "HCVAB"  Does the patient have current of drug use, have a partner with drug use, and/or has been incarcerated since last result? No  If yes-- Screen for HCV through [facility]   Does the patient meet criteria for HBV testing? No  Criteria:  -Household, sexual or needle sharing contact with HBV -History of drug use -HIV positive -Those with known Hep C   Health Maintenance Due  Topic Date Due   COVID-19 Vaccine (1) Never done   INFLUENZA VACCINE  05/16/2022    Review of Systems  Constitutional:  Negative for weight loss.  Eyes:  Negative for blurred vision.  Respiratory:  Negative for cough and shortness of breath.   Cardiovascular:  Negative for claudication.   Gastrointestinal:  Negative for nausea.  Genitourinary:  Negative for dysuria and frequency.  Skin:  Negative for rash.  Neurological:  Negative for headaches.  Endo/Heme/Allergies:  Does not bruise/bleed easily.    The following portions of the patient's history were reviewed and updated as appropriate: allergies, current medications, past family history, past medical history, past social history, past surgical history and problem list. Problem list updated.  Objective:   Vitals:   12/05/22 1426  BP: 115/74  Pulse: 63  Weight: 204 lb 6.4 oz (92.7 kg)  Height: 5' 9"$  (1.753 m)    Physical Exam Constitutional:      Appearance: Normal appearance.  HENT:     Head: Normocephalic and atraumatic.  Pulmonary:     Effort: Pulmonary effort is normal.  Abdominal:     Palpations: Abdomen is soft.  Musculoskeletal:        General: Normal range of motion.  Skin:    General: Skin is warm and dry.  Neurological:     General: No focal deficit present.     Mental Status: She is alert.  Psychiatric:        Mood and Affect: Mood normal.        Behavior: Behavior normal.    Assessment and Plan:  Janet Roach is a 27 y.o. female G2P2002 presenting to the [facility] Department for an yearly wellness and contraception visit   Contraception counseling: Reviewed options based on patient  desire and reproductive life plan. Patient is interested in Oral Contraceptive. This was provided to the patient today.   Risks, benefits, and typical effectiveness rates were reviewed.  Questions were answered.  Written information was also given to the patient to review.    The patient will follow up in  1 years for surveillance.  The patient was told to call with any further questions, or with any concerns about this method of contraception.  Emphasized use of condoms 100% of the time for STI prevention.  Patient was assessed for need for ECP. Not indicated- pt is on micronor.   1. Well  woman exam (no gynecological exam) -Pap test not due until 05/2024 -CBE not indicated until 01/2024  2. Family planning counseling -rx for micronor sent to [facility] -order placed under supervising MD d/t insurance status  3. Elevated blood pressure reading 140/82 on 08/16/21 @ 39 4/7 -reviewed that even though BP good today, pt has consistent elevated levels of BP -discussed that this puts pt at risk for blood clots, and I would not recommend we put her back on tri lo sprintec -pt states they have not had problems with micronor, and agree to stay on this method  Return if symptoms worsen or fail to improve.  No future appointments.  Sharlet Salina, [place]

## 2023-06-13 ENCOUNTER — Encounter: Payer: Self-pay | Admitting: Oncology

## 2023-09-24 ENCOUNTER — Ambulatory Visit: Payer: Medicaid Other

## 2023-09-24 VITALS — BP 139/86 | Ht 68.0 in | Wt 216.5 lb

## 2023-09-24 DIAGNOSIS — Z3201 Encounter for pregnancy test, result positive: Secondary | ICD-10-CM

## 2023-09-24 DIAGNOSIS — Z309 Encounter for contraceptive management, unspecified: Secondary | ICD-10-CM

## 2023-09-24 DIAGNOSIS — H52213 Irregular astigmatism, bilateral: Secondary | ICD-10-CM | POA: Diagnosis not present

## 2023-09-24 LAB — PREGNANCY, URINE: Preg Test, Ur: POSITIVE — AB

## 2023-09-24 MED ORDER — PRENATAL 27-0.8 MG PO TABS: 1.0000 | ORAL_TABLET | Freq: Every day | ORAL | Status: AC

## 2023-09-24 NOTE — Progress Notes (Signed)
UPT positive. Plans prenatal care at [facility]. Positive preg packet given and reviewed.   Taking micronor daily, but time she takes it varies some each night.  LMP 07/07/2023.  RN advised patient to stop ocp.  Consult E Raymonda Pell, CNM who was informed of patient status. Patient wants prenatal care at [facility]. Has episodes of HBP in past, no HBP meds. BP today 139/86. Per E Eulogio Requena, CNM ok to start prenatal care at [facility].   The patient was dispensed prenatal vitamins #100 today per SO Dr Lorrin Mais. I provided counseling today regarding the medication. We discussed the medication, the side effects and when to call clinic. Patient given the opportunity to ask questions. Questions answered.   Sent to clerk for new OB appt and presumptive eligibility medicaid/preg women.  Jerel Shepherd, RN Consulted on the plan of care for this client.  I agree with the documented note and actions taken to provide care for this client.  Hazle Coca, CNM

## 2023-09-24 NOTE — Progress Notes (Signed)
UPT positive. Plans prenatal care at [facility]. Positive preg packet given and reviewed.   Taking micronor daily, but time she takes it varies some each night.  LMP 07/07/2023.  RN advised patient to stop ocp.  Consult E Sciora, CNM who was informed of patient status. Patient wants prenatal care at [facility]. Has episodes of HBP in past, no HBP meds. BP today 139/86. Per E Sciora, CNM ok to start prenatal care at [facility].   The patient was dispensed prenatal vitamins #100 today per SO Dr Lorrin Mais. I provided counseling today regarding the medication. We discussed the medication, the side effects and when to call clinic. Patient given the opportunity to ask questions. Questions answered.   Sent to clerk for new OB appt and presumptive eligibility medicaid/preg women.  Jerel Shepherd, RN

## 2023-09-28 DIAGNOSIS — H5213 Myopia, bilateral: Secondary | ICD-10-CM | POA: Diagnosis not present

## 2023-10-17 NOTE — L&D Delivery Note (Signed)
 Delivery Note   Janet Roach is a 28 y.o. G3P2002 at 104w0d Estimated Date of Delivery: 04/12/24  PRE-OPERATIVE DIAGNOSIS:  1) [redacted]w[redacted]d pregnancy.  2) Anemia  POST-OPERATIVE DIAGNOSIS:  1) [redacted]w[redacted]d pregnancy s/p Vaginal, Spontaneous  Delivery Type: Vaginal, Spontaneous   Delivery Anesthesia: None  Labor Complications:  avulsed umbilical cord    ESTIMATED BLOOD LOSS: 100 ml    FINDINGS:   1) female infant, Apgar scores of 8   at 1 minute and 9   at 5 minutes and a birthweight of 115.34 ounces.     SPECIMENS:   PLACENTA:   Appearance: Intact   Removal: Spontaneous;Expressed     Disposition:  discarded  CORD BLOOD: n/a  DISPOSITION:  Infant left in stable condition in the delivery room, with L&D personnel and mother,  NARRATIVE SUMMARY: Labor course:  Janet Roach is a [id number] at [redacted]w[redacted]d who presented to Labor & Delivery for labor management. She was found to be completely dilated with a BBOW on her initial cervical exam . Labor proceeded spontaneosly. With excellent maternal pushing effort, she birthed a viable female infant at 21 with the head en caul. The BOW spontaneously ruptured after delivery of the head. There was not a nuchal cord. The shoulders were birthed without difficulty. The infant was placed skin-to-skin with mother. The cord was doubly clamped and cut when pulsations ceased. The patient was given 10 U of IM pitocin  since she did not yet have an IV.   While applying gentle traction during attempted delivery of the placenta, the umbilical cord avulsed. The remaining portion of the cord was not able to be found on vaginal exam and the placenta remained in the uterus. Dr. Leonce was called to the bedside. An IV was started and the patient was given 100 mcg of IV fentanyl . An attempt was made to locate the placenta and manually remove while awaiting the arrival of Dr. Leonce without success. Dr. Leonce at the bedside-see his note for delivery of the  placenta. Patient given IV pitocin  and 1 g Ancef  after successful delivery of the placenta.  A perineal and vaginal examination was performed. Episiotomy/Lacerations: None The patient tolerated this well. Mother and baby were left in stable condition.  Dr. Leonce was immediately available for the care of this patient.   Lauraine PARAS Kahil Agner, CNM 04/05/2024 1:58 PM

## 2023-10-24 ENCOUNTER — Ambulatory Visit: Payer: Medicaid Other | Admitting: Family Medicine

## 2023-10-24 ENCOUNTER — Encounter: Payer: Self-pay | Admitting: Family Medicine

## 2023-10-24 VITALS — BP 118/78 | HR 84 | Temp 97.6°F | Wt 226.0 lb

## 2023-10-24 DIAGNOSIS — Z3482 Encounter for supervision of other normal pregnancy, second trimester: Secondary | ICD-10-CM | POA: Diagnosis not present

## 2023-10-24 DIAGNOSIS — O99210 Obesity complicating pregnancy, unspecified trimester: Secondary | ICD-10-CM

## 2023-10-24 DIAGNOSIS — R030 Elevated blood-pressure reading, without diagnosis of hypertension: Secondary | ICD-10-CM

## 2023-10-24 DIAGNOSIS — O99212 Obesity complicating pregnancy, second trimester: Secondary | ICD-10-CM

## 2023-10-24 DIAGNOSIS — Z3480 Encounter for supervision of other normal pregnancy, unspecified trimester: Secondary | ICD-10-CM

## 2023-10-24 DIAGNOSIS — O0932 Supervision of pregnancy with insufficient antenatal care, second trimester: Secondary | ICD-10-CM

## 2023-10-24 DIAGNOSIS — O0930 Supervision of pregnancy with insufficient antenatal care, unspecified trimester: Secondary | ICD-10-CM | POA: Insufficient documentation

## 2023-10-24 DIAGNOSIS — F1291 Cannabis use, unspecified, in remission: Secondary | ICD-10-CM | POA: Insufficient documentation

## 2023-10-24 DIAGNOSIS — Z862 Personal history of diseases of the blood and blood-forming organs and certain disorders involving the immune mechanism: Secondary | ICD-10-CM

## 2023-10-24 HISTORY — DX: Encounter for supervision of other normal pregnancy, unspecified trimester: Z34.80

## 2023-10-24 HISTORY — DX: Cannabis use, unspecified, in remission: F12.91

## 2023-10-24 LAB — WET PREP FOR TRICH, YEAST, CLUE
Trichomonas Exam: NEGATIVE
Yeast Exam: NEGATIVE

## 2023-10-24 LAB — HEMOGLOBIN, FINGERSTICK: Hemoglobin: 12.4 g/dL (ref 11.1–15.9)

## 2023-10-24 MED ORDER — ASPIRIN 81 MG PO TBEC: 81.0000 mg | DELAYED_RELEASE_TABLET | Freq: Every day | ORAL | Status: DC

## 2023-10-24 NOTE — Progress Notes (Signed)
 Patient here for new OB visit at about 15 4/7. Declines flu and covid vaccines. MMR x 2, Varicella x 2. Last Pap was 01/31/2021, NIL. BMI labs today.Burt Knack, RN

## 2023-10-24 NOTE — Progress Notes (Signed)
 Hgb = 12.4, and wet prep reviewed. No treatment indicated. Patient declined flu and covid vaccines. ASA given and patient counseled to take 1 tablet each day. Burt Knack, RN

## 2023-10-24 NOTE — Progress Notes (Signed)
 [facility] [address] [place] [address] Main phone: [phone]  Initial Prenatal Visit  Subjective:  Janet Roach is a 28 y.o. G3P2002 at [redacted]w[redacted]d being seen today to start prenatal care at the [facility] Department.  She is currently monitored for the following issues for this low-risk pregnancy and has History of iron  deficiency anemia; Blood pressure elevated without history of HTN; Maternal obesity, antepartum- pregravid BMI= 32; Supervision of other normal pregnancy, antepartum; Late prenatal care @ approx 15 weeks; and History of marijuana use on their problem list.  Patient reports no complaints.  Contractions: Not present. Vag. Bleeding: None.  Movement: Absent. Denies leaking of fluid.   Indications for ASA therapy (per uptodate) One of the following: Previous pregnancy with preeclampsia, especially early onset and with an adverse outcome No Multifetal gestation No Chronic hypertension No Type 1 or 2 diabetes mellitus No Chronic kidney disease No Autoimmune disease (antiphospholipid syndrome, systemic lupus erythematosus) No  Two or more of the following: Nulliparity No Obesity (body mass index >30 kg/m2) Yes Family history of preeclampsia in mother or sister No*8 Age >=35 years No Sociodemographic characteristics (African American race, low socioeconomic level) Yes Personal risk factors (eg, previous pregnancy with low birth weight or small for gestational age infant, previous adverse pregnancy outcome [eg, stillbirth], interval >10 years between pregnancies) No  The following portions of the patient's history were reviewed and updated as appropriate: allergies, current medications, past family history, past medical history, past social history, past surgical history and problem list. Problem list updated.  Objective:   Vitals:   10/24/23 0858  BP: 118/78  Pulse: 84  Temp: 97.6 F (36.4  C)  Weight: 226 lb (102.5 kg)    Fetal Status: Fetal Heart Rate (bpm): 143 Fundal Height: 18 cm Movement: Absent      Physical Exam Vitals and nursing note reviewed. Exam conducted with a chaperone present Stage Manager).  Constitutional:      General: She is not in acute distress.    Appearance: She is well-developed. She is obese.  HENT:     Head: Normocephalic and atraumatic.     Right Ear: External ear normal.     Left Ear: External ear normal.     Nose: Nose normal. No congestion or rhinorrhea.     Mouth/Throat:     Lips: Pink.     Mouth: Mucous membranes are moist.     Dentition: Normal dentition. No dental caries.     Pharynx: Oropharynx is clear. Uvula midline.     Comments: Dentition: good Eyes:     General: No scleral icterus.    Conjunctiva/sclera: Conjunctivae normal.  Neck:     Thyroid : No thyroid  mass or thyromegaly.  Cardiovascular:     Rate and Rhythm: Normal rate.     Pulses: Normal pulses.     Comments: Extremities are warm and well perfused Pulmonary:     Effort: Pulmonary effort is normal.     Breath sounds: Normal breath sounds.  Chest:     Chest wall: No mass.  Breasts:    Tanner Score is 5.     Breasts are symmetrical.     Right: Normal. No mass, nipple discharge or skin change.     Left: Normal. No mass, nipple discharge or skin change.  Abdominal:     General: Abdomen is flat.     Palpations: Abdomen is soft.     Tenderness: There is  no abdominal tenderness.     Comments: Gravid   Genitourinary:    General: Normal vulva.     Exam position: Lithotomy position.     Pubic Area: No rash.      Labia:        Right: No rash.        Left: No rash.      Vagina: Vaginal discharge present.     Cervix: Friability present. No cervical motion tenderness.     Uterus: Normal. Enlarged (Gravid 18 size). Not tender.      Adnexa: Right adnexa normal and left adnexa normal.     Rectum: Normal. No external hemorrhoid.     Comments: Thick white  discharge present pH= 4 Musculoskeletal:     Right lower leg: No edema.     Left lower leg: No edema.  Lymphadenopathy:     Cervical: No cervical adenopathy.     Upper Body:     Right upper body: No axillary adenopathy.     Left upper body: No axillary adenopathy.  Skin:    General: Skin is warm.     Capillary Refill: Capillary refill takes less than 2 seconds.  Neurological:     Mental Status: She is alert.     Assessment and Plan:  Pregnancy: G3P2002 at [redacted]w[redacted]d  1. Supervision of other normal pregnancy, antepartum (Primary) -28 year old female in clinic today for prenatal care. Patient excited about the pregnancy. Lives with fiance and 2 kids (1 boy 1 girl). -Patient states last LMP 07/07/23, pt unsure of last period- dating US  ordered -Patient states she is taking PNV daily. -PAP= due in April ,but will do today -Denies alcohol and drug use.  UDS not indicated.  -Patient received dental care few months ago.  Patient given dental information.   -Agrees to Maternti21 -Declined COVID and Flu- reviewed recommendation for getting vaccines in pregnancy -EDPS= 1, will continue to monitor.   - Pregnancy, Initial Screen - MaterniT 21 plus Core, Blood - Comprehensive metabolic panel - Protein / creatinine ratio, urine - Hemoglobin, fingerstick - IGP, rfx Aptima HPV ASCU - WET PREP FOR TRICH, YEAST, CLUE - aspirin  EC 81 MG tablet; Take 1 tablet (81 mg total) by mouth daily. Swallow whole.  2. Maternal obesity, antepartum- pregravid BMI= 32 -ASA recommendation discussed accepts and given today -total weight gain of 11-20#  discussed today -MNT referral ordered today  - Glucose, 1 hour - TSH - Hgb A1c w/o eAG - aspirin  EC 81 MG tablet; Take 1 tablet (81 mg total) by mouth daily. Swallow whole.  3. Late prenatal care -entered care at approx 15 weeks  4. History of marijuana use -reports she quit smoking in July 2024, before she was pregnant  5. History of iron  deficiency  anemia -Hgb and anemia panel ordered today  6. Blood pressure elevated without history of HTN -ASA recommendation discussed  accepts and given to start taking today -reviewed pre-E prevention in pregnancy with ASA   - aspirin  EC 81 MG tablet; Take 1 tablet (81 mg total) by mouth daily. Swallow whole.     Discussed overview of care and coordination with inpatient delivery practices including [facility],  [facility].   Reviewed Centering pregnancy as standard of care at [facility], oriented to room and showed video. Interested in Centering   Preterm labor symptoms and general obstetric precautions including but not limited to vaginal bleeding, contractions, leaking of fluid and fetal movement were reviewed in detail  with the patient.  Please refer to After Visit Summary for other counseling recommendations.   Return in about 4 weeks (around 11/21/2023) for Routine Prenatal Care.  Future Appointments  Date Time Provider Department Center  11/22/2023  8:20 AM [facility] PROVIDER [facility] None    Verneta Bers, FNP

## 2023-10-25 ENCOUNTER — Encounter: Payer: Self-pay | Admitting: Oncology

## 2023-10-25 LAB — FE+CBC/D/PLT+TIBC+FER+RETIC
Basophils Absolute: 0 10*3/uL (ref 0.0–0.2)
Basos: 0 %
EOS (ABSOLUTE): 0.1 10*3/uL (ref 0.0–0.4)
Eos: 1 %
Ferritin: 16 ng/mL (ref 15–150)
Hematocrit: 37.9 % (ref 34.0–46.6)
Hemoglobin: 12.5 g/dL (ref 11.1–15.9)
Immature Grans (Abs): 0 10*3/uL (ref 0.0–0.1)
Immature Granulocytes: 0 %
Iron Saturation: 18 % (ref 15–55)
Iron: 58 ug/dL (ref 27–159)
Lymphocytes Absolute: 2.1 10*3/uL (ref 0.7–3.1)
Lymphs: 22 %
MCH: 28.8 pg (ref 26.6–33.0)
MCHC: 33 g/dL (ref 31.5–35.7)
MCV: 87 fL (ref 79–97)
Monocytes Absolute: 0.7 10*3/uL (ref 0.1–0.9)
Monocytes: 7 %
Neutrophils Absolute: 6.9 10*3/uL (ref 1.4–7.0)
Neutrophils: 70 %
Platelets: 230 10*3/uL (ref 150–450)
RBC: 4.34 x10E6/uL (ref 3.77–5.28)
RDW: 12.6 % (ref 11.7–15.4)
Retic Ct Pct: 2.6 % (ref 0.6–2.6)
Total Iron Binding Capacity: 321 ug/dL (ref 250–450)
UIBC: 263 ug/dL (ref 131–425)
WBC: 9.8 10*3/uL (ref 3.4–10.8)

## 2023-10-25 LAB — IGP, RFX APTIMA HPV ASCU: PAP Smear Comment: 0

## 2023-10-28 LAB — PROTEIN / CREATININE RATIO, URINE
Creatinine, Urine: 115.8 mg/dL
Protein, Ur: 7.7 mg/dL
Protein/Creat Ratio: 66 mg/g{creat} (ref 0–200)

## 2023-10-28 LAB — PREGNANCY, INITIAL SCREEN
Antibody Screen: NEGATIVE
Basophils Absolute: 0 10*3/uL (ref 0.0–0.2)
Basos: 0 %
Bilirubin, UA: NEGATIVE
Chlamydia trachomatis, NAA: NEGATIVE
EOS (ABSOLUTE): 0.1 10*3/uL (ref 0.0–0.4)
Eos: 1 %
Glucose, UA: NEGATIVE
HCV Ab: NONREACTIVE
HIV Screen 4th Generation wRfx: NONREACTIVE
Hematocrit: 38.2 % (ref 34.0–46.6)
Hemoglobin: 12.9 g/dL (ref 11.1–15.9)
Hepatitis B Surface Ag: NEGATIVE
Immature Grans (Abs): 0 10*3/uL (ref 0.0–0.1)
Immature Granulocytes: 0 %
Ketones, UA: NEGATIVE
Leukocytes,UA: NEGATIVE
Lymphocytes Absolute: 2.2 10*3/uL (ref 0.7–3.1)
Lymphs: 22 %
MCH: 29.7 pg (ref 26.6–33.0)
MCHC: 33.8 g/dL (ref 31.5–35.7)
MCV: 88 fL (ref 79–97)
Monocytes Absolute: 0.7 10*3/uL (ref 0.1–0.9)
Monocytes: 7 %
Neisseria Gonorrhoeae by PCR: NEGATIVE
Neutrophils Absolute: 6.9 10*3/uL (ref 1.4–7.0)
Neutrophils: 70 %
Nitrite, UA: NEGATIVE
Platelets: 224 10*3/uL (ref 150–450)
Protein,UA: NEGATIVE
RBC, UA: NEGATIVE
RBC: 4.34 x10E6/uL (ref 3.77–5.28)
RDW: 12.5 % (ref 11.7–15.4)
RPR Ser Ql: NONREACTIVE
Rh Factor: POSITIVE
Rubella Antibodies, IGG: 12.9 {index} (ref >0.99)
Specific Gravity, UA: 1.02 (ref 1.005–1.030)
Urobilinogen, Ur: 0.2 mg/dL (ref 0.2–1.0)
WBC: 9.9 10*3/uL (ref 3.4–10.8)
pH, UA: 7.5 (ref 5.0–7.5)

## 2023-10-28 LAB — MICROSCOPIC EXAMINATION
Casts: NONE SEEN /[LPF]
WBC, UA: NONE SEEN /[HPF] (ref 0–5)

## 2023-10-28 LAB — COMPREHENSIVE METABOLIC PANEL
ALT: 9 [IU]/L (ref 0–32)
AST: 13 [IU]/L (ref 0–40)
Albumin: 3.7 g/dL — ABNORMAL LOW (ref 4.0–5.0)
Alkaline Phosphatase: 49 [IU]/L (ref 44–121)
BUN/Creatinine Ratio: 13 (ref 9–23)
BUN: 8 mg/dL (ref 6–20)
Bilirubin Total: <0.2 mg/dL (ref 0.0–1.2)
CO2: 22 mmol/L (ref 20–29)
Calcium: 9.1 mg/dL (ref 8.7–10.2)
Chloride: 102 mmol/L (ref 96–106)
Creatinine, Ser: 0.64 mg/dL (ref 0.57–1.00)
Globulin, Total: 2.4 g/dL (ref 1.5–4.5)
Glucose: 84 mg/dL (ref 70–99)
Potassium: 4.2 mmol/L (ref 3.5–5.2)
Sodium: 135 mmol/L (ref 134–144)
Total Protein: 6.1 g/dL (ref 6.0–8.5)
eGFR: 125 mL/min/{1.73_m2} (ref >59)

## 2023-10-28 LAB — MATERNIT 21 PLUS CORE, BLOOD
Fetal Fraction: 22
Result (T21): NEGATIVE
Trisomy 13 (Patau syndrome): NEGATIVE
Trisomy 18 (Edwards syndrome): NEGATIVE
Trisomy 21 (Down syndrome): NEGATIVE

## 2023-10-28 LAB — URINE CULTURE, OB REFLEX

## 2023-10-28 LAB — GLUCOSE, 1 HOUR GESTATIONAL: Gestational Diabetes Screen: 79 mg/dL (ref 70–139)

## 2023-10-28 LAB — HCV INTERPRETATION

## 2023-10-28 LAB — HGB A1C W/O EAG: Hgb A1c MFr Bld: 5.1 % (ref 4.8–5.6)

## 2023-10-28 LAB — TSH: TSH: 2.36 u[IU]/mL (ref 0.450–4.500)

## 2023-11-12 ENCOUNTER — Telehealth: Payer: Self-pay

## 2023-11-12 DIAGNOSIS — Z3480 Encounter for supervision of other normal pregnancy, unspecified trimester: Secondary | ICD-10-CM

## 2023-11-12 NOTE — Progress Notes (Signed)
..  Patient declines further follow up and engagement by the Managed Medicaid Team. Appropriate care team members and provider have been notified via electronic communication. The Managed Medicaid Team is available to follow up with the patient after provider conversation with the patient regarding recommendation for engagement and subsequent re-referral to the Managed Medicaid Team.     Weston Settle [facility], [facility] Guide Direct Dial: [phone]  Fax: [phone]

## 2023-11-22 ENCOUNTER — Encounter: Payer: Self-pay | Admitting: Physician Assistant

## 2023-11-22 ENCOUNTER — Ambulatory Visit: Payer: Medicaid Other | Admitting: Physician Assistant

## 2023-11-22 VITALS — BP 99/65 | HR 79 | Temp 97.7°F | Wt 228.6 lb

## 2023-11-22 DIAGNOSIS — Z3482 Encounter for supervision of other normal pregnancy, second trimester: Secondary | ICD-10-CM

## 2023-11-22 DIAGNOSIS — O99212 Obesity complicating pregnancy, second trimester: Secondary | ICD-10-CM

## 2023-11-22 DIAGNOSIS — O99210 Obesity complicating pregnancy, unspecified trimester: Secondary | ICD-10-CM

## 2023-11-22 DIAGNOSIS — Z3480 Encounter for supervision of other normal pregnancy, unspecified trimester: Secondary | ICD-10-CM

## 2023-11-22 DIAGNOSIS — Z3A19 19 weeks gestation of pregnancy: Secondary | ICD-10-CM

## 2023-11-22 NOTE — Progress Notes (Signed)
 Patient here for MH RV at 19 5/7. Declines AFP today. Rosiland Cooks, RN

## 2023-11-22 NOTE — Progress Notes (Signed)
  [facility] [address] [place] [address] Main phone: [phone]  Prenatal Visit  Subjective:  Janet Roach is a 28 y.o. G3P2002 at [redacted]w[redacted]d being seen today for ongoing prenatal care.  She is currently monitored for the following issues for this low-risk pregnancy:   Patient Active Problem List   Diagnosis Date Noted   Supervision of other normal pregnancy, antepartum 10/24/2023   Late prenatal care @ approx 15 weeks 10/24/2023   History of marijuana use 10/24/2023   Maternal obesity, antepartum- pregravid BMI= 32 12/05/2022   Blood pressure elevated without history of HTN 08/16/2021   History of iron  deficiency anemia 08/10/2021   Patient reports no complaints.  Contractions: Not present. Vag. Bleeding: None.  Movement: Present. Denies leaking of fluid/ROM.   The following portions of the patient's history were reviewed and updated as appropriate: allergies, current medications, past family history, past medical history, past social history, past surgical history and problem list. Problem list updated.  Objective:   Vitals:   11/22/23 0830  BP: 99/65  Pulse: 79  Temp: 97.7 F (36.5 C)  Weight: 228 lb 9.6 oz (103.7 kg)    Fetal Status: Fetal Heart Rate (bpm): 132 Fundal Height: 21 cm Movement: Present     General:  Alert, oriented and cooperative. Patient is in no acute distress.  Skin: Skin is warm and dry. No rash noted.   Cardiovascular: Normal heart rate noted  Respiratory: Normal respiratory effort, no problems with respiration noted  Abdomen: Soft, gravid, appropriate for gestational age.  Pain/Pressure: Absent     Pelvic: Cervical exam deferred        Extremities: Normal range of motion.  Edema: None  Mental Status: Normal mood and affect. Normal behavior. Normal judgment and thought content.   Assessment and Plan:  Pregnancy: G3P2002 at [redacted]w[redacted]d  1. [redacted] weeks gestation of pregnancy RV in 4  weeks, will be in Centering Cycle 1, first meeting.  2. Supervision of other normal pregnancy, antepartum (Primary) Reviewed results of initial prenatal lab results with patient. Pt declines AFP only for ONTD screening. Schedule routine fetal anatomy scan.   3. Maternal obesity, antepartum- pregravid BMI= 32 Taking daily low-dose aspirin . TWG 13 lb, high for stage in pregnancy (goal TWG 11-20lb for preg). Reviewed diet/exercise regimen. Pt has met with WIC, and has MNT appt 12/25/23.   Preterm labor symptoms and general obstetric precautions including but not limited to vaginal bleeding, contractions, leaking of fluid and fetal movement were reviewed in detail with the patient. Please refer to After Visit Summary for other counseling recommendations.  Return in about 4 weeks (around 12/20/2023) for Routine prenatal care.  Future Appointments  Date Time Provider Department Center  11/29/2023 11:00 AM [facility]  [facility]  [facility]    Saul Irvine, PA-C

## 2023-11-29 ENCOUNTER — Ambulatory Visit
Admission: RE | Admit: 2023-11-29 | Discharge: 2023-11-29 | Disposition: A | Discharge status: Home / Self Care | Payer: Medicaid Other | Source: Ambulatory Visit | Attending: Family Medicine | Admitting: Family Medicine

## 2023-11-29 DIAGNOSIS — Z3689 Encounter for other specified antenatal screening: Secondary | ICD-10-CM | POA: Insufficient documentation

## 2023-11-29 DIAGNOSIS — Z3A20 20 weeks gestation of pregnancy: Secondary | ICD-10-CM | POA: Insufficient documentation

## 2023-11-29 DIAGNOSIS — Z3687 Encounter for antenatal screening for uncertain dates: Secondary | ICD-10-CM | POA: Diagnosis not present

## 2023-11-29 DIAGNOSIS — O0930 Supervision of pregnancy with insufficient antenatal care, unspecified trimester: Secondary | ICD-10-CM | POA: Diagnosis not present

## 2023-11-29 DIAGNOSIS — Z3480 Encounter for supervision of other normal pregnancy, unspecified trimester: Secondary | ICD-10-CM

## 2023-12-03 ENCOUNTER — Encounter: Payer: Self-pay | Admitting: Family Medicine

## 2023-12-04 ENCOUNTER — Telehealth: Payer: Self-pay

## 2023-12-04 ENCOUNTER — Other Ambulatory Visit: Payer: Self-pay | Admitting: Family Medicine

## 2023-12-04 DIAGNOSIS — Z3480 Encounter for supervision of other normal pregnancy, unspecified trimester: Secondary | ICD-10-CM

## 2023-12-04 NOTE — Progress Notes (Signed)
Reviewed limited views of certain structures in [place]. Offered follow up US to patient. Patient agrees to follow up. Ordered in this orders only encounter- RN notified.  1. Supervision of other normal pregnancy, antepartum (Primary)  - US OB Follow Up; Future   [facility] FNP-C

## 2023-12-04 NOTE — Telephone Encounter (Signed)
Phone call to patient to inform of scheduled follow up US appt for 12/24/23 @ 2:30 with an arrival time of 2:15 at [facility]. Instructions given to drink 32 ozs of water 30 minutes prior to appt. Patient verbalized understanding of above. Tawny Hopping, RN

## 2023-12-19 ENCOUNTER — Telehealth: Payer: Self-pay | Admitting: Family Medicine

## 2023-12-20 ENCOUNTER — Ambulatory Visit

## 2023-12-20 ENCOUNTER — Ambulatory Visit: Payer: Medicaid Other

## 2023-12-20 VITALS — BP 117/72 | HR 84 | Wt 236.6 lb

## 2023-12-20 DIAGNOSIS — O99210 Obesity complicating pregnancy, unspecified trimester: Secondary | ICD-10-CM

## 2023-12-20 DIAGNOSIS — Z3A23 23 weeks gestation of pregnancy: Secondary | ICD-10-CM

## 2023-12-20 DIAGNOSIS — Z3482 Encounter for supervision of other normal pregnancy, second trimester: Secondary | ICD-10-CM

## 2023-12-20 DIAGNOSIS — Z3480 Encounter for supervision of other normal pregnancy, unspecified trimester: Secondary | ICD-10-CM

## 2023-12-20 NOTE — Progress Notes (Signed)
 Centering Pregnancy, Session#1: Introduction to model of care. Group determined rules for self-governance and closing phrase. Oriented group to space and mother's notebook.   Facilitated discussion today:  nutrition and oral health.  Mindfulness activity completed as well as introduction to deep breathing for childbirth preparation.    Fundal height and FHR appropriate today unless noted otherwise in plan. Patient to continue group care.    1. [redacted] weeks gestation of pregnancy -previously declined AFP- reoffered today  2. Supervision of other normal pregnancy, antepartum (Primary) -previously review [place] from 11/29/23- patient agreed to follow up- scheduled for 12/24/23 -taking PNV daily  3. Maternal obesity, antepartum- pregravid BMI= 32 -taking ASA daily -twg   [facility] FNP-C

## 2023-12-20 NOTE — Progress Notes (Signed)
 Patient here for Centering pregnancy Group 1, cycle 1. Declines AFP.Marland KitchenBurt Knack, RN

## 2023-12-20 NOTE — Progress Notes (Signed)
  [facility] [address], [address] [place] [address] Main phone: [phone]  Prenatal Visit  Subjective:  Janet Roach is a 28 y.o. G3P2002 at [redacted]w[redacted]d being seen today for ongoing prenatal care.  She is currently monitored for the following issues for this low-risk pregnancy:   Patient Active Problem List   Diagnosis Date Noted   Supervision of other normal pregnancy, antepartum 10/24/2023   Late prenatal care @ approx 15 weeks 10/24/2023   History of marijuana use 10/24/2023   Maternal obesity, antepartum- pregravid BMI= 32 12/05/2022   Blood pressure elevated without history of HTN 08/16/2021   History of iron deficiency anemia 08/10/2021   Patient reports no complaints.  Contractions: Not present. Vag. Bleeding: None.  Movement: Present. Denies leaking of fluid/ROM.   The following portions of the patient's history were reviewed and updated as appropriate: allergies, current medications, past family history, past medical history, past social history, past surgical history and problem list. Problem list updated.  Objective:   Vitals:   12/20/23 0944  BP: 117/72  Pulse: 84  Weight: 236 lb 9.6 oz (107.3 kg)    Fetal Status: Fetal Heart Rate (bpm): 23 Fundal Height: 141 cm Movement: Present     General:  Alert, oriented and cooperative. Patient is in no acute distress.  Skin: Skin is warm and dry. No rash noted.   Cardiovascular: Normal heart rate noted  Respiratory: Normal respiratory effort, no problems with respiration noted  Abdomen: Soft, gravid, appropriate for gestational age.  Pain/Pressure: Present     Pelvic: Cervical exam deferred        Extremities: Normal range of motion.  Edema: None  Mental Status: Normal mood and affect. Normal behavior. Normal judgment and thought content.   Assessment and Plan:  Pregnancy: G3P2002 at [redacted]w[redacted]d  1. [redacted] weeks gestation of pregnancy -previously declined AFP-  reoffered today- declined  2. Supervision of other normal pregnancy, antepartum (Primary) -previously review [place] from 11/29/23- patient agreed to follow up- scheduled for 12/24/23 -taking PNV daily  Centering Pregnancy, Session#1: Introduction to model of care. Group determined rules for self-governance and closing phrase. Oriented group to space and mother's notebook.   Facilitated discussion today:  nutrition, common discomforts of pregnancy, PTL Mindfulness activity completed as well as introduction to deep breathing for childbirth preparation.    Fundal height and FHR appropriate today unless noted otherwise in plan. Patient to continue group care.   3. Maternal obesity, antepartum- pregravid BMI= 32 -taking ASA daily 21 lb 9.6 oz (9.798 kg) -slightly over expected weight gain- reviewed in group over eating and mindfulness as well as ways to exercise in pregnancy  Lenice Llamas FNP-C   Preterm labor symptoms and general obstetric precautions including but not limited to vaginal bleeding, contractions, leaking of fluid and fetal movement were reviewed in detail with the patient. Please refer to After Visit Summary for other counseling recommendations.  Return in about 4 weeks (around 01/17/2024) for Centering Pregnancy .  Future Appointments  Date Time Provider Department Center  12/24/2023  2:30 PM [facility] 4 [facility]    [place], [place]

## 2023-12-24 ENCOUNTER — Ambulatory Visit
Admission: RE | Admit: 2023-12-24 | Discharge: 2023-12-24 | Disposition: A | Discharge status: Home / Self Care | Payer: Medicaid Other | Source: Ambulatory Visit | Attending: Family Medicine | Admitting: Family Medicine

## 2023-12-24 DIAGNOSIS — Z3480 Encounter for supervision of other normal pregnancy, unspecified trimester: Secondary | ICD-10-CM | POA: Insufficient documentation

## 2023-12-24 DIAGNOSIS — Z3689 Encounter for other specified antenatal screening: Secondary | ICD-10-CM | POA: Diagnosis not present

## 2023-12-24 DIAGNOSIS — Z3A23 23 weeks gestation of pregnancy: Secondary | ICD-10-CM | POA: Insufficient documentation

## 2023-12-24 DIAGNOSIS — Z3482 Encounter for supervision of other normal pregnancy, second trimester: Secondary | ICD-10-CM | POA: Diagnosis not present

## 2023-12-24 DIAGNOSIS — Z362 Encounter for other antenatal screening follow-up: Secondary | ICD-10-CM | POA: Diagnosis not present

## 2024-01-14 ENCOUNTER — Encounter: Payer: Self-pay | Admitting: Family Medicine

## 2024-01-16 ENCOUNTER — Telehealth: Payer: Self-pay | Admitting: Family Medicine

## 2024-01-16 NOTE — Progress Notes (Unsigned)
  [facility] [address], [address] [place] [address] Main phone: [phone]  Prenatal Visit  Subjective:  Janet Roach is a 28 y.o. G3P2002 at [redacted]w[redacted]d being seen today for ongoing prenatal care.  She is currently monitored for the following issues for this low-risk pregnancy:   Patient Active Problem List   Diagnosis Date Noted   Supervision of other normal pregnancy, antepartum 10/24/2023   Late prenatal care @ approx 15 weeks 10/24/2023   History of marijuana use 10/24/2023   Maternal obesity, antepartum- pregravid BMI= 32 12/05/2022   Blood pressure elevated without history of HTN 08/16/2021   History of iron deficiency anemia 08/10/2021   Patient reports {[id number]}.   .  .   . Denies leaking of fluid/ROM.   The following portions of the patient's history were reviewed and updated as appropriate: allergies, current medications, past family history, past medical history, past social history, past surgical history and problem list. Problem list updated.  Objective:  There were no vitals filed for this visit.  Fetal Status:           General:  Alert, oriented and cooperative. Patient is in no acute distress.  Skin: Skin is warm and dry. No rash noted.   Cardiovascular: Normal heart rate noted  Respiratory: Normal respiratory effort, no problems with respiration noted  Abdomen: Soft, gravid, appropriate for gestational age.        Pelvic: Cervical exam deferred        Extremities: Normal range of motion.     Mental Status: Normal mood and affect. Normal behavior. Normal judgment and thought content.   Assessment and Plan:  Pregnancy: G3P2002 at [redacted]w[redacted]d  1. [redacted] weeks gestation of pregnancy -routine 28 week labs today   2. Supervision of other normal pregnancy, antepartum (Primary) -reviewed that scan on 3/10- wnl however continued issues see lip/profile, offered to order follow up vs no other scan -patient  decided she     3. Blood pressure elevated without history of HTN ***  4. Maternal obesity, antepartum- pregravid BMI= 32 -taking ASA daily -twg  -pre- E packet   Preterm labor symptoms and general obstetric precautions including but not limited to vaginal bleeding, contractions, leaking of fluid and fetal movement were reviewed in detail with the patient. Please refer to After Visit Summary for other counseling recommendations.  No follow-ups on file.  Future Appointments  Date Time Provider Department Center  01/17/2024  9:30 AM [facility] PROVIDER AC-MAT None  02/14/2024  9:30 AM [facility] PROVIDER AC-MAT None  03/13/2024  9:30 AM [facility] PROVIDER AC-MAT None  03/27/2024  9:30 AM [facility] PROVIDER AC-MAT None  04/10/2024  9:30 AM AC-MH PROVIDER AC-MAT None  04/24/2024  9:30 AM [facility]MH PROVIDER AC-MAT None  05/08/2024  9:30 AM [facility]-MH PROVIDER AC-MAT None  05/22/2024  9:30 AM AC-MH PROVIDER AC-MAT None  06/05/2024  9:30 AM [facility]-MH PROVIDER AC-MAT None    Lenice Llamas, FNP

## 2024-01-17 ENCOUNTER — Encounter: Payer: Self-pay | Admitting: Family Medicine

## 2024-01-17 ENCOUNTER — Ambulatory Visit: Admitting: Family Medicine

## 2024-01-17 VITALS — BP 127/80 | Wt 242.0 lb

## 2024-01-17 DIAGNOSIS — O99213 Obesity complicating pregnancy, third trimester: Secondary | ICD-10-CM

## 2024-01-17 DIAGNOSIS — Z3A27 27 weeks gestation of pregnancy: Secondary | ICD-10-CM | POA: Diagnosis not present

## 2024-01-17 DIAGNOSIS — Z3483 Encounter for supervision of other normal pregnancy, third trimester: Secondary | ICD-10-CM

## 2024-01-17 DIAGNOSIS — Z23 Encounter for immunization: Secondary | ICD-10-CM

## 2024-01-17 DIAGNOSIS — O99210 Obesity complicating pregnancy, unspecified trimester: Secondary | ICD-10-CM

## 2024-01-17 DIAGNOSIS — Z3480 Encounter for supervision of other normal pregnancy, unspecified trimester: Secondary | ICD-10-CM

## 2024-01-17 DIAGNOSIS — Z3492 Encounter for supervision of normal pregnancy, unspecified, second trimester: Secondary | ICD-10-CM | POA: Diagnosis not present

## 2024-01-17 DIAGNOSIS — R030 Elevated blood-pressure reading, without diagnosis of hypertension: Secondary | ICD-10-CM

## 2024-01-17 LAB — HEMOGLOBIN, FINGERSTICK: Hemoglobin: 11.1 g/dL (ref 11.1–15.9)

## 2024-01-17 NOTE — Progress Notes (Signed)
 Hgb = 11.1 today. Tdap given, tolerated well, VIS and NCIR given.Burt Knack, RN

## 2024-01-17 NOTE — Progress Notes (Signed)
 Patient here for Centering pregnancy at 27 5/7. 28 week labs today, Tdap and 1 hour gtt. BTL consent signed. Patient scheduled for 2 week clinic visit.Burt Knack, RN

## 2024-01-18 ENCOUNTER — Telehealth: Payer: Self-pay

## 2024-01-18 LAB — GLUCOSE, 1 HOUR GESTATIONAL: Gestational Diabetes Screen: 74 mg/dL (ref 70–139)

## 2024-01-18 NOTE — Telephone Encounter (Signed)
 TC to client to inform of [facility] U/S on 01/29/24 at 1:00. Patient counseled to arrive 12:45. Patient states understanding and knows to go to the [facility].Burt Knack, RN

## 2024-01-19 LAB — HIV-1/HIV-2 QUALITATIVE RNA
HIV-1 RNA, Qualitative: NONREACTIVE
HIV-2 RNA, Qualitative: NONREACTIVE

## 2024-01-19 LAB — RPR: RPR Ser Ql: NONREACTIVE

## 2024-01-29 ENCOUNTER — Ambulatory Visit
Admission: RE | Admit: 2024-01-29 | Discharge: 2024-01-29 | Disposition: A | Discharge status: Home / Self Care | Source: Ambulatory Visit | Attending: Family Medicine | Admitting: Family Medicine

## 2024-01-29 DIAGNOSIS — Z3A28 28 weeks gestation of pregnancy: Secondary | ICD-10-CM | POA: Diagnosis not present

## 2024-01-29 DIAGNOSIS — Z3480 Encounter for supervision of other normal pregnancy, unspecified trimester: Secondary | ICD-10-CM | POA: Insufficient documentation

## 2024-01-29 DIAGNOSIS — Z3689 Encounter for other specified antenatal screening: Secondary | ICD-10-CM | POA: Insufficient documentation

## 2024-01-29 DIAGNOSIS — Z3402 Encounter for supervision of normal first pregnancy, second trimester: Secondary | ICD-10-CM | POA: Diagnosis not present

## 2024-01-31 ENCOUNTER — Ambulatory Visit: Admitting: Family Medicine

## 2024-01-31 ENCOUNTER — Encounter: Payer: Self-pay | Admitting: Family Medicine

## 2024-01-31 VITALS — BP 106/72 | HR 93 | Wt 241.6 lb

## 2024-01-31 DIAGNOSIS — R030 Elevated blood-pressure reading, without diagnosis of hypertension: Secondary | ICD-10-CM

## 2024-01-31 DIAGNOSIS — O99210 Obesity complicating pregnancy, unspecified trimester: Secondary | ICD-10-CM

## 2024-01-31 DIAGNOSIS — Z3483 Encounter for supervision of other normal pregnancy, third trimester: Secondary | ICD-10-CM

## 2024-01-31 DIAGNOSIS — Z3480 Encounter for supervision of other normal pregnancy, unspecified trimester: Secondary | ICD-10-CM

## 2024-01-31 DIAGNOSIS — O99213 Obesity complicating pregnancy, third trimester: Secondary | ICD-10-CM

## 2024-01-31 DIAGNOSIS — Z3A29 29 weeks gestation of pregnancy: Secondary | ICD-10-CM

## 2024-01-31 NOTE — Progress Notes (Signed)
  [facility] [address] [place] [address] Main phone: [phone]  Prenatal Visit  Subjective:  Janet Roach is a 28 y.o. G3P2002 at [redacted]w[redacted]d being seen today for ongoing prenatal care.  She is currently monitored for the following issues for this low-risk pregnancy:   Patient Active Problem List   Diagnosis Date Noted   Supervision of other normal pregnancy, antepartum 10/24/2023   Late prenatal care @ approx 15 weeks 10/24/2023   History of marijuana use 10/24/2023   Maternal obesity, antepartum- pregravid BMI= 32 12/05/2022   Blood pressure elevated without history of HTN 08/16/2021   History of iron deficiency anemia 08/10/2021   Patient reports no complaints.  Contractions: Not present. Vag. Bleeding: None.  Movement: Present. Denies leaking of fluid/ROM.   The following portions of the patient's history were reviewed and updated as appropriate: allergies, current medications, past family history, past medical history, past social history, past surgical history and problem list. Problem list updated.  Objective:   Vitals:   01/31/24 1309  BP: 106/72  Pulse: 93  Weight: 241 lb 9.6 oz (109.6 kg)    Fetal Status: Fetal Heart Rate (bpm): 143 Fundal Height: 31 cm Movement: Present     General:  Alert, oriented and cooperative. Patient is in no acute distress.  Skin: Skin is warm and dry. No rash noted.   Cardiovascular: Normal heart rate noted  Respiratory: Normal respiratory effort, no problems with respiration noted  Abdomen: Soft, gravid, appropriate for gestational age.  Pain/Pressure: Present     Pelvic: Cervical exam deferred        Extremities: Normal range of motion.  Edema: None  Mental Status: Normal mood and affect. Normal behavior. Normal judgment and thought content.   Assessment and Plan:  Pregnancy: G3P2002 at [redacted]w[redacted]d  1. [redacted] weeks gestation of pregnancy (Primary) -reviewed 28 week  labs- wnl  2. Supervision of other normal pregnancy, antepartum -taking PNV daily -in centering cycle 1- next appt 5/1  3. Maternal obesity, antepartum- pregravid BMI= 32 -taking ASA daily 26 lb 9.6 oz (12.1 kg) -had follow up US  4/15- results not available yet   4. Blood pressure elevated without history of HTN BP today normotensive   Preterm labor symptoms and general obstetric precautions including but not limited to vaginal bleeding, contractions, leaking of fluid and fetal movement were reviewed in detail with the patient. Please refer to After Visit Summary for other counseling recommendations.  Return in about 2 weeks (around 02/14/2024) for Routine Prenatal Care.  Future Appointments  Date Time Provider Department Center  02/14/2024  9:30 AM [facility]-MH PROVIDER AC-MAT None  03/13/2024  9:30 AM AC-MH PROVIDER AC-MAT None  03/27/2024  9:30 AM AC-MH PROVIDER AC-MAT None  04/10/2024  9:30 AM AC-MH PROVIDER AC-MAT None  04/24/2024  9:30 AM [facility] PROVIDER AC-MAT None  05/08/2024  9:30 AM AC-MH PROVIDER AC-MAT None  05/22/2024  9:30 AM AC-MH PROVIDER AC-MAT None  06/05/2024  9:30 AM AC-MH PROVIDER AC-MAT None    Earleen Glazier, FNP

## 2024-01-31 NOTE — Progress Notes (Signed)
 Patient here for MH RV at 29 5/7. Aaron AasRosiland Cooks, RN

## 2024-02-07 NOTE — Progress Notes (Addendum)
 [facility] [address] [place] [address] Main phone: [phone]  Prenatal Visit  Subjective:  Janet Roach is a 28 y.o. G3P2002 at [redacted]w[redacted]d being seen today for ongoing prenatal care.  She is currently monitored for the following issues for this low-risk pregnancy:   Patient Active Problem List   Diagnosis Date Noted   Tubal ligation desired 02/14/2024   Supervision of other normal pregnancy, antepartum 10/24/2023   Late prenatal care @ approx 15 weeks 10/24/2023   History of marijuana use 10/24/2023   Maternal obesity, antepartum- pregravid BMI= 32 12/05/2022   Blood pressure elevated without history of HTN 08/16/2021   History of iron  deficiency anemia 08/10/2021   Patient reports occasional contractions.  Contractions: Not present. Vag. Bleeding: None.  Movement: Present. Denies leaking of fluid/ROM.   The following portions of the patient's history were reviewed and updated as appropriate: allergies, current medications, past family history, past medical history, past social history, past surgical history and problem list. Problem list updated.  Objective:   Vitals:   02/14/24 0943  BP: 128/88  Pulse: 97  Weight: 243 lb (110.2 kg)   Fetal Status: Fetal Heart Rate (bpm): 144 Fundal Height: 31 cm Movement: Present     General:  Alert, oriented and cooperative. Patient is in no acute distress.  Skin: Skin is warm and dry. No rash noted.   Cardiovascular: Normal heart rate noted  Respiratory: Normal respiratory effort, no problems with respiration noted  Abdomen: Soft, gravid, appropriate for gestational age.  Pain/Pressure: Absent     Pelvic: Cervical exam deferred        Extremities: Normal range of motion.     Mental Status: Normal mood and affect. Normal behavior. Normal judgment and thought content.   Assessment and Plan:  Pregnancy: G3P2002 at [redacted]w[redacted]d  [redacted] weeks gestation of  pregnancy  Supervision of other normal pregnancy, antepartum Assessment & Plan: Doing well. BP normal at 128/88. TWG 28 lb (12.7 kg). Taking prenatal vitamin daily and aspirin  81 mg daily. Next prenatal appointment in 2 weeks.   Discussed or disclosed in AVS: - Breast feeding support through [facility] breast feeding peer counselor program - Mood resources, including 988, Maternal Mental Health Line, and counselor Nyle Belling, LCSW, here at [facility] - Prenatal classes - Pain control during delivery: unsure, will depend on labor progress - Support person during delivery: Fiance - Contraception: BTL, consent signed 01/17/24 - Pre-eclampsia warning signs: Signs and symptoms of preeclampsia were verbally reviewed with warning signs, when and how to call. A written handout with tips on how to take your blood pressure, as well as warning signs was provided to the patient.    Centering Pregnancy, Session#3: Reviewed resources in [employer].   Facilitated discussion today:  Intimate partner Violence/Family safety and breastfeeding Mindfulness activity completed as well as deep breathing with still touch for childbirth preparation.      Tubal ligation desired Assessment & Plan: BTL consent signed 01/17/2024.   Maternal obesity, antepartum- pregravid BMI= 32 Assessment & Plan: Taking aspirin  daily without s/e. No complaints.    Preterm labor symptoms and general obstetric precautions including but not limited to vaginal bleeding, contractions, leaking of fluid and fetal movement were reviewed in detail with the patient. Please refer to After Visit Summary for other counseling recommendations.  Return in about 2 weeks (around 02/28/2024).  Future Appointments  Date Time Provider Department Center  03/13/2024  9:30 AM AC-MH PROVIDER AC-MAT None  03/27/2024  9:30 AM AC-MH PROVIDER AC-MAT None  04/10/2024  9:30 AM [facility]-MH PROVIDER AC-MAT None  04/24/2024  9:30 AM AC-MH PROVIDER AC-MAT None   05/08/2024  9:30 AM AC-MH PROVIDER AC-MAT None  05/22/2024  9:30 AM [facility] PROVIDER AC-MAT None  06/05/2024  9:30 AM [facility] PROVIDER AC-MAT None   Jack Marts, MD

## 2024-02-13 ENCOUNTER — Telehealth: Payer: Self-pay | Admitting: Family Medicine

## 2024-02-14 ENCOUNTER — Encounter: Payer: Self-pay | Admitting: Family Medicine

## 2024-02-14 ENCOUNTER — Ambulatory Visit: Admitting: Family Medicine

## 2024-02-14 VITALS — BP 128/88 | HR 97 | Wt 243.0 lb

## 2024-02-14 DIAGNOSIS — Z308 Encounter for other contraceptive management: Secondary | ICD-10-CM | POA: Insufficient documentation

## 2024-02-14 DIAGNOSIS — Z3480 Encounter for supervision of other normal pregnancy, unspecified trimester: Secondary | ICD-10-CM

## 2024-02-14 DIAGNOSIS — Z3A31 31 weeks gestation of pregnancy: Secondary | ICD-10-CM

## 2024-02-14 DIAGNOSIS — Z3483 Encounter for supervision of other normal pregnancy, third trimester: Secondary | ICD-10-CM

## 2024-02-14 DIAGNOSIS — O99213 Obesity complicating pregnancy, third trimester: Secondary | ICD-10-CM

## 2024-02-14 DIAGNOSIS — O99210 Obesity complicating pregnancy, unspecified trimester: Secondary | ICD-10-CM

## 2024-02-14 HISTORY — DX: Encounter for other contraceptive management: Z30.8

## 2024-02-14 NOTE — Assessment & Plan Note (Signed)
 Taking aspirin  daily without s/e. No complaints.

## 2024-02-14 NOTE — Progress Notes (Signed)
 Here for Centering pregnancy at 42 5/7. CMHRP and [place] today. Kick counts reviewed with client and 4 cards given.Rosiland Cooks, RN

## 2024-02-14 NOTE — Assessment & Plan Note (Signed)
 BTL consent signed 01/17/2024.

## 2024-02-14 NOTE — Assessment & Plan Note (Addendum)
 Doing well. BP normal at 128/88. TWG 28 lb (12.7 kg). Taking prenatal vitamin daily and aspirin  81 mg daily. Next prenatal appointment in 2 weeks.   Discussed or disclosed in AVS: - Breast feeding support through [facility] breast feeding peer counselor program - Mood resources, including 988, Maternal Mental Health Line, and counselor Nyle Belling, LCSW, here at [facility] - Prenatal classes - Pain control during delivery: unsure, will depend on labor progress - Support person during delivery: Fiance - Contraception: BTL, consent signed 01/17/24 - Pre-eclampsia warning signs: Signs and symptoms of preeclampsia were verbally reviewed with warning signs, when and how to call. A written handout with tips on how to take your blood pressure, as well as warning signs was provided to the patient.    Centering Pregnancy, Session#3: Reviewed resources in [employer].   Facilitated discussion today:  Intimate partner Violence/Family safety and breastfeeding Mindfulness activity completed as well as deep breathing with still touch for childbirth preparation.

## 2024-02-14 NOTE — Patient Instructions (Addendum)
 Pregnancy Continue taking your prenatal vitamin daily and aspirin  81 mg daily.  Please schedule your next prenatal visit for about 2 weeks from now.  Please visit [facility]IC (women's, infants, and children program) to see about their breast feeding peer support program. You can start this program to get tips and tricks for successful breast feeding of your baby.   Your labs from around 28 weeks in pregnancy were all normal. We screened for diabetes of pregnancy, anemia, HIV, and syphilis.   Your hemoglobin level (red blood cell level) was technically normal but is getting closer to the anemia range. Please increase your intake of iron -rich foods to help give you and your baby what your bodies need to grow! See the hand out provided with these papers for lots of ideas on iron  rich foods.   Prenatal Classes If delivering at [facility] with [facility] or [facility]: Go to [email or web address]   If delivering at [facility]: Go to https://www.[facility].org/ and search for: "Pregnancy and Parenting Classes" "Prepared Childbirth Classes"  Resource regarding exposures that could affect pregnancy: Mother To [employer] is a Dentist with lots of information on the effects of many medications and exposures on your pregnancy. It is free to use, including their web site, phone line, text service, app, or email and live chat.  http://golden-thomas.org/ Email or live chat: MotherToBaby.org Phone: [phone] Text: 704-712-8748 App: search "[employer]"   Maternal Mental Health If you start to develop the below symptoms of depression, please reach out to us  for an appointment. There is also a Biomedical scientist Health Hotline at [phone] 7324229568). This hotline has trained counselors, doulas, and midwifes to real-time support, information, and resources.  Feeling sad or hopeless most of the time Lack of interest in  things you used to enjoy Less interest in caring for yourself (dressing, fixing hair) Trouble concentrating Trouble coping with daily tasks Constant worry about your baby Sleeping or eating too much or too little Feeling very anxious or nervous Unexplained irritability or anger Unwanted or scary thoughts Feeling that you are not a good mother Thoughts of hurting yourself or your baby  If you feel you are experiencing a mental health crisis, please reach out to the National Suicide Prevention Hotline at 1-800-273-TALK 236 195 1176).

## 2024-02-18 ENCOUNTER — Encounter: Payer: Self-pay | Admitting: Family Medicine

## 2024-02-25 DIAGNOSIS — Z3483 Encounter for supervision of other normal pregnancy, third trimester: Secondary | ICD-10-CM | POA: Diagnosis not present

## 2024-02-25 DIAGNOSIS — Z3482 Encounter for supervision of other normal pregnancy, second trimester: Secondary | ICD-10-CM | POA: Diagnosis not present

## 2024-02-29 ENCOUNTER — Ambulatory Visit: Admitting: Family Medicine

## 2024-02-29 VITALS — BP 101/67 | HR 82 | Temp 96.9°F | Wt 243.6 lb

## 2024-02-29 DIAGNOSIS — O99013 Anemia complicating pregnancy, third trimester: Secondary | ICD-10-CM

## 2024-02-29 DIAGNOSIS — Z862 Personal history of diseases of the blood and blood-forming organs and certain disorders involving the immune mechanism: Secondary | ICD-10-CM

## 2024-02-29 DIAGNOSIS — Z3480 Encounter for supervision of other normal pregnancy, unspecified trimester: Secondary | ICD-10-CM

## 2024-02-29 DIAGNOSIS — Z3483 Encounter for supervision of other normal pregnancy, third trimester: Secondary | ICD-10-CM

## 2024-02-29 DIAGNOSIS — Z308 Encounter for other contraceptive management: Secondary | ICD-10-CM

## 2024-02-29 DIAGNOSIS — R030 Elevated blood-pressure reading, without diagnosis of hypertension: Secondary | ICD-10-CM

## 2024-02-29 LAB — HEMOGLOBIN, FINGERSTICK: Hemoglobin: 10.7 g/dL — ABNORMAL LOW (ref 11.1–15.9)

## 2024-02-29 MED ORDER — PRENATAL VITAMINS 28-0.8 MG PO TABS: 1.0000 | ORAL_TABLET | Freq: Every day | ORAL | Status: DC

## 2024-02-29 MED ORDER — PRENATAL 27-0.8 MG PO TABS: 1.0000 | ORAL_TABLET | Freq: Every day | ORAL | Status: DC

## 2024-02-29 MED ORDER — IRON (FERROUS SULFATE) 325 (65 FE) MG PO TABS: 325.0000 mg | ORAL_TABLET | Freq: Two times a day (BID) | ORAL | Status: DC

## 2024-02-29 NOTE — Assessment & Plan Note (Deleted)
 Lat Hgb normal, but trend decreasing towards anemia. Will recheck today.

## 2024-02-29 NOTE — Progress Notes (Signed)
 [facility] [address], [address] [place] [address] Main phone: [phone]  Prenatal Visit  Subjective:  Janet Roach is a 28 y.o. G3P2002 at [redacted]w[redacted]d being seen today for ongoing prenatal care.  She is currently monitored for the following issues for this low-risk pregnancy:   Patient Active Problem List   Diagnosis Date Noted   Tubal ligation desired 02/14/2024   Supervision of other normal pregnancy, antepartum 10/24/2023   Late prenatal care @ approx 15 weeks 10/24/2023   History of marijuana use 10/24/2023   Maternal obesity, antepartum- pregravid BMI= 32 12/05/2022   Blood pressure elevated without history of HTN 08/16/2021   History of iron  deficiency anemia 08/10/2021   Anemia during pregnancy in third trimester 05/30/2021   Patient reports occasional contractions.  Contractions: Not present. Vag. Bleeding: None.  Movement: Present. Denies leaking of fluid/ROM.   The following portions of the patient's history were reviewed and updated as appropriate: allergies, current medications, past family history, past medical history, past social history, past surgical history and problem list. Problem list updated.  Objective:   Vitals:   02/29/24 1007  BP: 101/67  Pulse: 82  Temp: (!) 96.9 F (36.1 C)  Weight: 243 lb 9.6 oz (110.5 kg)   Fetal Status: Fetal Heart Rate (bpm): 136 Fundal Height: 33 cm Movement: Present     General:  Alert, oriented and cooperative. Patient is in no acute distress.  Skin: Skin is warm and dry. No rash noted.   Cardiovascular: Normal heart rate noted  Respiratory: Normal respiratory effort, no problems with respiration noted  Abdomen: Soft, gravid, appropriate for gestational age.  Pain/Pressure: Absent     Pelvic: Cervical exam deferred        Extremities: Normal range of motion.     Mental Status: Normal mood and affect. Normal behavior. Normal judgment and thought content.    Assessment and Plan:  Pregnancy: G3P2002 at [redacted]w[redacted]d  Supervision of other normal pregnancy, antepartum Assessment & Plan: Participating in Centering Pregnancy, Cycle 1. Doing well. BP normal at 101/67. TWG 28 lb 9.6 oz (13 kg). Taking prenatal vitamin daily and aspirin  81 mg daily. Next prenatal appointment in 2 weeks.   Discussed or disclosed in AVS: - Breast feeding support through [facility] breast feeding peer counselor program - Mood resources, including 988, Maternal Mental Health Line, and counselor Nyle Belling, [place], here at [facility] - Prenatal classes - Support person during delivery: Partner Susana Enter  - Anticipated contraception: female sterilization - Pre-eclampsia warning signs: Signs and symptoms of preeclampsia were verbally reviewed with warning signs, when and how to call. A written handout with tips on how to take your blood pressure, as well as warning signs was provided to the patient.   Orders: -     Prenatal Vitamins; Take 1 tablet by mouth daily. -     Iron  (Ferrous Sulfate ); Take 325 mg by mouth in the morning and at bedtime.  Blood pressure elevated without history of HTN Assessment & Plan: BP normal today. Will continue to monitor.    History of iron  deficiency anemia  Tubal ligation desired Assessment & Plan: BTL consent signed 01/17/2024. Delivering with [facility].    Anemia during pregnancy in third trimester Assessment & Plan: Hx IDA in previous pregnancy. Last Hgb normal, but trend decreasing towards anemia. Recheck today showed 10.7 - now within anemia range. PO iron  every other day started by RN per protocol. Plan to recheck Hgb in one month.  Orders: -     Hemoglobin, fingerstick -     Iron  (Ferrous Sulfate ); Take 325 mg by mouth in the morning and at bedtime.  Preterm labor symptoms and general obstetric precautions including but not limited to vaginal bleeding, contractions, leaking of fluid and fetal movement were reviewed in detail with the  patient. Please refer to After Visit Summary for other counseling recommendations.  Return in about 2 weeks (around 03/14/2024).  Future Appointments  Date Time Provider Department Center  03/13/2024  9:30 AM AC-MH PROVIDER AC-MAT None  03/27/2024  9:30 AM AC-MH PROVIDER AC-MAT None  04/10/2024  9:30 AM AC-MH PROVIDER AC-MAT None  04/24/2024  9:30 AM AC-MH PROVIDER AC-MAT None  05/08/2024  9:30 AM [facility] PROVIDER AC-MAT None  05/22/2024  9:30 AM [facility] PROVIDER AC-MAT None  06/05/2024  9:30 AM [facility] PROVIDER AC-MAT None   Jack Marts, MD

## 2024-02-29 NOTE — Assessment & Plan Note (Signed)
 Hx IDA in previous pregnancy. Last Hgb normal, but trend decreasing towards anemia. Recheck today showed 10.7 - now within anemia range. PO iron  every other day started by RN per protocol. Plan to recheck Hgb in one month.

## 2024-02-29 NOTE — Assessment & Plan Note (Addendum)
 Participating in Centering Pregnancy, Cycle 1. Doing well. BP normal at 101/67. TWG 28 lb 9.6 oz (13 kg). Taking prenatal vitamin daily and aspirin  81 mg daily. Next prenatal appointment in 2 weeks.   Discussed or disclosed in AVS: - Breast feeding support through [facility] breast feeding peer counselor program - Mood resources, including 988, Maternal Mental Health Line, and counselor Nyle Belling, [place], here at [facility] - Prenatal classes - Support person during delivery: Partner Janet Roach  - Anticipated contraception: female sterilization - Pre-eclampsia warning signs: Signs and symptoms of preeclampsia were verbally reviewed with warning signs, when and how to call. A written handout with tips on how to take your blood pressure, as well as warning signs was provided to the patient.

## 2024-02-29 NOTE — Patient Instructions (Signed)
 Pregnancy Continue taking your prenatal vitamin daily and aspirin  81 mg daily.  Please schedule your next prenatal visit for about 2 weeks from now.  Please visit WIC (women's, infants, and children program) to see about their breast feeding peer support program. You can start this program to get tips and tricks for successful breast feeding of your baby.   Prenatal Classes If delivering at [facility] with [facility] or [facility]: Go to [email or web address]   If delivering at [facility]: Go to [email or web address] and search for: "Pregnancy and Parenting Classes" "Prepared Childbirth Classes"  Resource regarding exposures that could affect pregnancy: Mother To Natale [employer] is a Dentist with lots of information on the effects of many medications and exposures on your pregnancy. It is free to use, including their web site, phone line, text service, app, or email and live chat.  [email or web address]/ Email or live chat: MotherToBaby.org Phone: [phone] Text: [phone] App: search "LactRx"   Maternal Mental Health If you start to develop the below symptoms of depression, please reach out to us  for an appointment. There is also a Biomedical scientist Health Hotline at [phone]). This hotline has trained counselors, doulas, and midwifes to real-time support, information, and resources.  Feeling sad or hopeless most of the time Lack of interest in things you used to enjoy Less interest in caring for yourself (dressing, fixing hair) Trouble concentrating Trouble coping with daily tasks Constant worry about your baby Sleeping or eating too much or too little Feeling very anxious or nervous Unexplained irritability or anger Unwanted or scary thoughts Feeling that you are not a good mother Thoughts of hurting yourself or your baby  If you feel you are experiencing a  mental health crisis, please reach out to the National Suicide Prevention Hotline at 1-800-273-TALK (231)463-0186).

## 2024-02-29 NOTE — Assessment & Plan Note (Addendum)
 BTL consent signed 01/17/2024. Delivering with [facility].

## 2024-02-29 NOTE — Assessment & Plan Note (Signed)
-   BP normal today. Will continue to monitor.

## 2024-02-29 NOTE — Progress Notes (Addendum)
 Reminded of upcoming Centering Group meeting on 05/29 @ 0930. In house hgb result reviewed during visit.  The patient was dispensed Iron  and PNV today. I provided counseling today regarding the medication. We discussed the medication, the side effects and when to call clinic. Patient given the opportunity to ask questions. Questions answered. Gery Kubas RN

## 2024-03-03 ENCOUNTER — Ambulatory Visit: Payer: Self-pay

## 2024-03-06 NOTE — Progress Notes (Unsigned)
 [facility] [address] [place] [address] Main phone: [phone]  Prenatal Visit  Subjective:  Janet Roach is a 28 y.o. G3P2002 at [redacted]w[redacted]d being seen today for ongoing prenatal care.  She is currently monitored for the following issues for this low-risk pregnancy:   Patient Active Problem List   Diagnosis Date Noted   Tubal ligation desired 02/14/2024   Supervision of other normal pregnancy, antepartum 10/24/2023   Late prenatal care @ approx 15 weeks 10/24/2023   History of marijuana use 10/24/2023   Maternal obesity, antepartum- pregravid BMI= 32 12/05/2022   Blood pressure elevated without history of HTN 08/16/2021   Iron  deficiency anemia 08/10/2021   Patient reports no complaints.  Contractions: Not present. Vag. Bleeding: None.  Movement: Present. Denies leaking of fluid/ROM.   The following portions of the patient's history were reviewed and updated as appropriate: allergies, current medications, past family history, past medical history, past social history, past surgical history and problem list. Problem list updated.  Objective:   Vitals:   03/13/24 0957  BP: 119/76  Pulse: 90  Weight: 246 lb (111.6 kg)    Fetal Status: Fetal Heart Rate (bpm): 127 Fundal Height: 36 cm Movement: Present     General:  Alert, oriented and cooperative. Patient is in no acute distress.  Skin: Skin is warm and dry. No rash noted.   Cardiovascular: Normal heart rate noted  Respiratory: Normal respiratory effort, no problems with respiration noted  Abdomen: Soft, gravid, appropriate for gestational age.  Pain/Pressure: Present     Pelvic: Cervical exam deferred        Extremities: Normal range of motion.  Edema: None  Mental Status: Normal mood and affect. Normal behavior. Normal judgment and thought content.   Assessment and Plan:  Pregnancy: G3P2002 at [redacted]w[redacted]d  1. [redacted] weeks gestation of pregnancy  (Primary) -counseled regarding weekly visits after next visit -will do 36 week cultures then  2. Supervision of other normal pregnancy, antepartum Centering Pregnancy, Session#4: Reviewed resources in [employer].   Facilitated discussion today:  Family dynamics/environment, family planning postpartum Mindfulness activity completed as well as deep breathing for childbirth preparation.    Fundal height and FHR appropriate today unless noted otherwise in plan. Patient to continue group care.    3. Iron  deficiency anemia, unspecified iron  deficiency anemia type -taking iron  EOD -check Hgb on 6/12 RV  4. Blood pressure elevated without history of HTN -BP wnl  5. Maternal obesity, antepartum- pregravid BMI= 32 -taking ASA daily  6. Tubal ligation desired -BTL consents signed 01/17/24 -desires to talk about different types of tubal ligation, as she was reading online about clips vs salpingectomy -form filled out today to request appt w delivery group ([facility]), RN to fax    Preterm labor symptoms and general obstetric precautions including but not limited to vaginal bleeding, contractions, leaking of fluid and fetal movement were reviewed in detail with the patient. Please refer to After Visit Summary for other counseling recommendations.  Return in about 1 week (around 03/20/2024) for Routine Prenatal Care, 36 week labs.  Future Appointments  Date Time Provider Department Center  03/18/2024  3:00 PM [facility] PROVIDER AC[facility] None  03/27/2024  9:30 AM [facility] PROVIDER AC[facility] None  04/10/2024  9:30 AM [facility] PROVIDER AC[facility] None  04/24/2024  9:30 AM [facility] PROVIDER AC[facility] None  05/08/2024  9:30 AM [facility] PROVIDER AC[facility] None  05/22/2024  9:30 AM [facility] PROVIDER AC[facility] None  06/05/2024  9:30 AM [facility] PROVIDER AC[facility] None    Earleen Glazier, FNP

## 2024-03-12 ENCOUNTER — Telehealth: Payer: Self-pay | Admitting: Family Medicine

## 2024-03-13 ENCOUNTER — Ambulatory Visit: Admitting: Family Medicine

## 2024-03-13 ENCOUNTER — Encounter: Payer: Self-pay | Admitting: Family Medicine

## 2024-03-13 VITALS — BP 119/76 | HR 90 | Wt 246.0 lb

## 2024-03-13 DIAGNOSIS — Z308 Encounter for other contraceptive management: Secondary | ICD-10-CM

## 2024-03-13 DIAGNOSIS — Z3A35 35 weeks gestation of pregnancy: Secondary | ICD-10-CM

## 2024-03-13 DIAGNOSIS — R030 Elevated blood-pressure reading, without diagnosis of hypertension: Secondary | ICD-10-CM

## 2024-03-13 DIAGNOSIS — O99213 Obesity complicating pregnancy, third trimester: Secondary | ICD-10-CM

## 2024-03-13 DIAGNOSIS — Z3483 Encounter for supervision of other normal pregnancy, third trimester: Secondary | ICD-10-CM

## 2024-03-13 DIAGNOSIS — O99210 Obesity complicating pregnancy, unspecified trimester: Secondary | ICD-10-CM

## 2024-03-13 DIAGNOSIS — D509 Iron deficiency anemia, unspecified: Secondary | ICD-10-CM

## 2024-03-13 DIAGNOSIS — Z3480 Encounter for supervision of other normal pregnancy, unspecified trimester: Secondary | ICD-10-CM

## 2024-03-13 NOTE — Progress Notes (Signed)
 Faxed Referral to [facility], confirmation that fax received. Gery Kubas RN

## 2024-03-13 NOTE — Progress Notes (Addendum)
 Correctly verbalizes how to take iron  and prenatal vitamin as ordered. Taking iron  tablet with orange juice. 1 week [facility] RV appt scheduled by clerical staff and reminder card given. Ariel Begun, RN

## 2024-03-18 ENCOUNTER — Encounter: Payer: Self-pay | Admitting: Nurse Practitioner

## 2024-03-18 ENCOUNTER — Ambulatory Visit: Admitting: Nurse Practitioner

## 2024-03-18 VITALS — BP 120/75 | HR 82 | Temp 98.0°F | Wt 245.0 lb

## 2024-03-18 DIAGNOSIS — O99013 Anemia complicating pregnancy, third trimester: Secondary | ICD-10-CM

## 2024-03-18 DIAGNOSIS — Z3480 Encounter for supervision of other normal pregnancy, unspecified trimester: Secondary | ICD-10-CM

## 2024-03-18 DIAGNOSIS — Z3A36 36 weeks gestation of pregnancy: Secondary | ICD-10-CM

## 2024-03-18 DIAGNOSIS — O99213 Obesity complicating pregnancy, third trimester: Secondary | ICD-10-CM

## 2024-03-18 DIAGNOSIS — Z3493 Encounter for supervision of normal pregnancy, unspecified, third trimester: Secondary | ICD-10-CM | POA: Diagnosis not present

## 2024-03-18 DIAGNOSIS — Z3483 Encounter for supervision of other normal pregnancy, third trimester: Secondary | ICD-10-CM

## 2024-03-18 DIAGNOSIS — O99210 Obesity complicating pregnancy, unspecified trimester: Secondary | ICD-10-CM

## 2024-03-18 DIAGNOSIS — R030 Elevated blood-pressure reading, without diagnosis of hypertension: Secondary | ICD-10-CM

## 2024-03-18 NOTE — Progress Notes (Signed)

## 2024-03-18 NOTE — Progress Notes (Signed)
 Here for MH RV at 36 3/7. 36 week labs and packet today. Packet reviewed with client. . Patient is self-collecting her labs. Rosiland Cooks, RN

## 2024-03-18 NOTE — Progress Notes (Signed)
 [facility] [address], [address] [place] [address] Main phone: [phone]  Prenatal Visit  Subjective:  Janet Roach is a 28 y.o. G3P2002 at [redacted]w[redacted]d being seen today for ongoing prenatal care.  She is currently monitored for the following issues for this low-risk pregnancy:   Patient Active Problem List   Diagnosis Date Noted   Tubal ligation desired 02/14/2024   Supervision of other normal pregnancy, antepartum 10/24/2023   Late prenatal care @ approx 15 weeks 10/24/2023   History of marijuana use 10/24/2023   Maternal obesity, antepartum- pregravid BMI= 32 12/05/2022   Blood pressure elevated without history of HTN 08/16/2021   Iron  deficiency anemia 08/10/2021   Patient reports no complaints.  Contractions: Irregular. Vag. Bleeding: None.  Movement: Present. Denies leaking of fluid/ROM.   The following portions of the patient's history were reviewed and updated as appropriate: allergies, current medications, past family history, past medical history, past social history, past surgical history and problem list. Problem list updated.  Objective:   Vitals:   03/18/24 1527  BP: 120/75  Pulse: 82  Temp: 98 F (36.7 C)  Weight: 245 lb (111.1 kg)    Fetal Status: Fetal Heart Rate (bpm): 136 Fundal Height: 36 cm Movement: Present  Presentation: Vertex  General:  Alert, oriented and cooperative. Patient is in no acute distress.  Skin: Skin is warm and dry. No rash noted.   Cardiovascular: Normal heart rate noted  Respiratory: Normal respiratory effort, no problems with respiration noted  Abdomen: Soft, gravid, appropriate for gestational age.  Pain/Pressure: Present     Pelvic: Cervical exam deferred        Extremities: Normal range of motion.  Edema: None  Mental Status: Normal mood and affect. Normal behavior. Normal judgment and thought content.   Assessment and Plan:  Pregnancy: G3P2002 at  [redacted]w[redacted]d  1. [redacted] weeks gestation of pregnancy (Primary) Obtaining 36 week labs today. Patient self-collected GBS and Gonorrhea/Chlamydia. RV in 1 week for 37 week visit.   - Chlamydia/GC NAA, Confirmation - Culture, beta strep (group b only)  2. Supervision of other normal pregnancy, antepartum Reports no concerns today.  Reports taking PNV daily.  Centering Cycle 1. BTL consent signed 01/17/24 and referral for consultation sent 03/13/24 per note. No appointment scheduled to date.   3. Maternal obesity, antepartum- pregravid BMI= 32 Total Weight Gain: 30 lb (13.6 kg), -1 pound in 5 days.  Pregravid BMI 32.7 (Obese). Expected weight gain 11-20. Discussed exercise and water intake. Reports 4-5 bottles per day. Reports walking at least 1 mile per day.    4. Anemia during pregnancy in third trimester Hx of IDA in prior pregnancy. Taking Fe EOD.  Last hgb=10.7 on 02/19/24.  Plan to recheck hgb at next visit 03/27/24.  5. Blood pressure elevated without history of HTN BP 120/75 in office today and considered normal. Consistent with BP 5 days prior.  No concerns regarding Pre-E today.  Pre-E education done at 01/17/24 appointment.    Preterm labor symptoms and general obstetric precautions including but not limited to vaginal bleeding, contractions, leaking of fluid and fetal movement were reviewed in detail with the patient. Please refer to After Visit Summary for other counseling recommendations.  Return in about 1 week (around 03/25/2024) for 37 week prenatal care.  Future Appointments  Date Time Provider Department Center  03/27/2024  9:30 AM [facility] PROVIDER AC[facility] None  04/03/2024  3:00 PM [facility] PROVIDER AC[facility] None  04/10/2024  9:30 AM [facility] PROVIDER AC[facility] None  04/24/2024  9:30 AM [facility] PROVIDER AC[facility] None  05/08/2024  9:30 AM [facility] PROVIDER AC-[facility] None  05/22/2024  9:30 AM [facility] PROVIDER AC-[facility] None  06/05/2024  9:30 AM [facility] PROVIDER AC-[facility] None    Merleen Stare, NP

## 2024-03-20 ENCOUNTER — Ambulatory Visit: Payer: Self-pay | Admitting: Family Medicine

## 2024-03-20 DIAGNOSIS — Z3480 Encounter for supervision of other normal pregnancy, unspecified trimester: Secondary | ICD-10-CM

## 2024-03-20 LAB — CHLAMYDIA/GC NAA, CONFIRMATION
Chlamydia trachomatis, NAA: NEGATIVE
Neisseria gonorrhoeae, NAA: NEGATIVE

## 2024-03-22 LAB — CULTURE, BETA STREP (GROUP B ONLY): Strep Gp B Culture: NEGATIVE

## 2024-03-24 NOTE — Progress Notes (Signed)
 GBS negative. No actions needed.   Tempie Fee, MD 03/24/24  4:58 PM

## 2024-03-26 ENCOUNTER — Telehealth: Payer: Self-pay | Admitting: Family Medicine

## 2024-03-27 ENCOUNTER — Ambulatory Visit: Admitting: Nurse Practitioner

## 2024-03-27 ENCOUNTER — Encounter: Payer: Self-pay | Admitting: Nurse Practitioner

## 2024-03-27 VITALS — BP 121/85 | HR 104 | Wt 247.3 lb

## 2024-03-27 DIAGNOSIS — Z3483 Encounter for supervision of other normal pregnancy, third trimester: Secondary | ICD-10-CM

## 2024-03-27 DIAGNOSIS — O99210 Obesity complicating pregnancy, unspecified trimester: Secondary | ICD-10-CM

## 2024-03-27 DIAGNOSIS — O99013 Anemia complicating pregnancy, third trimester: Secondary | ICD-10-CM

## 2024-03-27 DIAGNOSIS — R030 Elevated blood-pressure reading, without diagnosis of hypertension: Secondary | ICD-10-CM

## 2024-03-27 DIAGNOSIS — O99213 Obesity complicating pregnancy, third trimester: Secondary | ICD-10-CM

## 2024-03-27 DIAGNOSIS — Z3A37 37 weeks gestation of pregnancy: Secondary | ICD-10-CM

## 2024-03-27 DIAGNOSIS — Z3480 Encounter for supervision of other normal pregnancy, unspecified trimester: Secondary | ICD-10-CM

## 2024-03-27 LAB — HEMOGLOBIN, FINGERSTICK: Hemoglobin: 10.6 g/dL — ABNORMAL LOW (ref 11.1–15.9)

## 2024-03-27 NOTE — Progress Notes (Signed)

## 2024-03-27 NOTE — Progress Notes (Signed)
 [facility] [address] [place] [address] Main phone: [phone]  Prenatal Visit  Subjective:  Janet Roach is a 28 y.o. G3P2002 at [redacted]w[redacted]d being seen today for ongoing prenatal care.  She is currently monitored for the following issues for this low-risk pregnancy:   Patient Active Problem List   Diagnosis Date Noted   Tubal ligation desired 02/14/2024   Supervision of other normal pregnancy, antepartum 10/24/2023   Late prenatal care @ approx 15 weeks 10/24/2023   History of marijuana use 10/24/2023   Maternal obesity, antepartum- pregravid BMI= 32 12/05/2022   Blood pressure elevated without history of HTN 08/16/2021   Iron  deficiency anemia 08/10/2021   Patient reports no complaints.  Contractions: Not present. Vag. Bleeding: None.  Movement: Present. Denies leaking of fluid/ROM.   The following portions of the patient's history were reviewed and updated as appropriate: allergies, current medications, past family history, past medical history, past social history, past surgical history and problem list. Problem list updated.  Objective:   Vitals:   03/27/24 0955  BP: 121/85  Pulse: (!) 104  Weight: 247 lb 4.8 oz (112.2 kg)    Fetal Status: Fetal Heart Rate (bpm): 148 Fundal Height: 35 cm Movement: Present  Presentation: Vertex  General:  Alert, oriented and cooperative. Patient is in no acute distress.  Skin: Skin is warm and dry. No rash noted.   Cardiovascular: Normal heart rate noted  Respiratory: Normal respiratory effort, no problems with respiration noted  Abdomen: Soft, gravid, appropriate for gestational age.  Pain/Pressure: Absent     Pelvic: Cervical exam deferred        Extremities: Normal range of motion.  Edema: Trace  Mental Status: Normal mood and affect. Normal behavior. Normal judgment and thought content.   Assessment and Plan:  Pregnancy: G3P2002 at [redacted]w[redacted]d  1. [redacted]  weeks gestation of pregnancy (Primary) RV in 1 week.  2. Supervision of other normal pregnancy, antepartum Taking PNV daily.  36 week labs negative last week. Patient aware.  BTL scheduled 06/17 at [facility].   Centering Pregnancy, Session#5: Reviewed resources in [employer]. Given Centering water bottle. Made rice socks  Facilitated discussion today:  Stages of Labor, Sign of labor, labor positions, coping strategies.  Reviewed deep relaxation breathing and use of pool noodle on low back for intrapartum massage.   Fundal height and FHR appropriate today unless noted otherwise in plan. Patient to continue group care.    3. Maternal obesity, antepartum- pregravid BMI= 32 Taking ASA daily. Total Weight Gain 32 lb 4.8 oz (14.7 kg) 2 lb gain in previous 1 week  Pre-gravid BMI 32.70 obese. Expected weight gain this pregnancy 11-20 pounds.  4. Anemia during pregnancy in third trimester Taking iron  EOD. Hgb today=10.6. Patient encouraged to continue taking PO iron  EOD with citrus juice. No further eval needed at this time.   5. Blood pressure elevated without history of HTN BP 121/85 today, and not concerning.  Patient denies symptoms of Pre-E. Plan to continue to monitor weekly until delivery or sooner if indicated.   Term labor symptoms and general obstetric precautions including but not limited to vaginal bleeding, contractions, leaking of fluid and fetal movement were reviewed in detail with the patient. Please refer to After Visit Summary for other counseling recommendations.  Return for 38 week prenatal care.  Future Appointments  Date Time Provider Department Center  04/01/2024  1:35 PM Zenobia Hila, MD [facility] None  04/04/2024  10:00 AM [facility] PROVIDER AC[facility] None  04/10/2024  9:30 AM [facility] PROVIDER AC-[facility] None  04/24/2024  9:30 AM [facility] PROVIDER AC-[facility] None  05/08/2024  9:30 AM [facility] PROVIDER AC-[facility] None  05/22/2024  9:30 AM [facility] PROVIDER AC-[facility] None  06/05/2024  9:30 AM  [facility] PROVIDER AC-[facility] None    Merleen Stare, NP

## 2024-03-27 NOTE — Progress Notes (Addendum)
 Here for Centering at 37 5/7. Hgb check today. Rosiland Cooks, RN   Hgb 10.6 today. Counseled to continue taking iron  once every other day.Rosiland Cooks, RN

## 2024-04-01 ENCOUNTER — Encounter: Payer: Self-pay | Admitting: Obstetrics and Gynecology

## 2024-04-01 ENCOUNTER — Ambulatory Visit (INDEPENDENT_AMBULATORY_CARE_PROVIDER_SITE_OTHER): Admitting: Obstetrics and Gynecology

## 2024-04-01 VITALS — BP 122/80 | HR 83 | Wt 248.3 lb

## 2024-04-01 DIAGNOSIS — Z3480 Encounter for supervision of other normal pregnancy, unspecified trimester: Secondary | ICD-10-CM

## 2024-04-01 DIAGNOSIS — Z3009 Encounter for other general counseling and advice on contraception: Secondary | ICD-10-CM

## 2024-04-01 DIAGNOSIS — Z3A38 38 weeks gestation of pregnancy: Secondary | ICD-10-CM | POA: Diagnosis not present

## 2024-04-01 DIAGNOSIS — Z3483 Encounter for supervision of other normal pregnancy, third trimester: Secondary | ICD-10-CM | POA: Diagnosis not present

## 2024-04-01 NOTE — Progress Notes (Signed)
 HPI:      [name] is a 28 y.o. [id number] who LMP was Patient's last menstrual period was 07/07/2023 (within days).  Subjective:   She presents today to discuss the possibility of permanent sterilization.  She has signed tubal papers for Medicaid and is considering this as a method of birth control.  She has had 2 prior vaginal birth and plans to have a third birth in the next 2 weeks.    Hx: The following portions of the patient's history were reviewed and updated as appropriate:             She  has a past medical history of Anemia, History of marijuana use (10/24/2023), Patient denies medical problems, Pica ice 2-3 c/day (06/27/2021), and Tubal ligation desired (02/14/2024). She does not have any pertinent problems on file. She  has a past surgical history that includes Strabismus surgery; Eye surgery; and Wisdom tooth extraction (Bilateral, 08/2022). Her family history includes Bone cancer in her maternal uncle; Breast cancer in her paternal aunt; Cancer in her paternal grandfather and paternal grandmother; Dementia in her maternal grandmother; Diabetes in her maternal grandmother; Heart disease in her maternal grandmother; Hypertension in her father, maternal grandmother, and mother; Miscarriages / Stillbirths in her maternal grandmother; Multiple births in her maternal aunt and maternal grandmother; Prostate cancer in her paternal uncle; Thyroid  disease in her maternal aunt. She  reports that she has never smoked. She has never been exposed to tobacco smoke. She has never used smokeless tobacco. She reports that she does not currently use alcohol. She reports that she does not currently use drugs after having used the following drugs: Marijuana. She has a current medication list which includes the following prescription(s): aspirin  ec, iron  (ferrous sulfate ), and prenatal vitamins. She has no known allergies.       Review of Systems:  Review of Systems  Constitutional: Denied  constitutional symptoms, night sweats, recent illness, fatigue, fever, insomnia and weight loss.  Eyes: Denied eye symptoms, eye pain, photophobia, vision change and visual disturbance.  Ears/Nose/Throat/Neck: Denied ear, nose, throat or neck symptoms, hearing loss, nasal discharge, sinus congestion and sore throat.  Cardiovascular: Denied cardiovascular symptoms, arrhythmia, chest pain/pressure, edema, exercise intolerance, orthopnea and palpitations.  Respiratory: Denied pulmonary symptoms, asthma, pleuritic pain, productive sputum, cough, dyspnea and wheezing.  Gastrointestinal: Denied, gastro-esophageal reflux, melena, nausea and vomiting.  Genitourinary: Denied genitourinary symptoms including symptomatic vaginal discharge, pelvic relaxation issues, and urinary complaints.  Musculoskeletal: Denied musculoskeletal symptoms, stiffness, swelling, muscle weakness and myalgia.  Dermatologic: Denied dermatology symptoms, rash and scar.  Neurologic: Denied neurology symptoms, dizziness, headache, neck pain and syncope.  Psychiatric: Denied psychiatric symptoms, anxiety and depression.  Endocrine: Denied endocrine symptoms including hot flashes and night sweats.   Meds:   Current Outpatient Medications on File Prior to Visit  Medication Sig Dispense Refill   aspirin  EC 81 MG tablet Take 1 tablet (81 mg total) by mouth daily. Swallow whole.     Iron , Ferrous Sulfate , 325 (65 Fe) MG TABS Take 325 mg by mouth in the morning and at bedtime.     Prenatal Vit-Fe Fumarate-FA (PRENATAL VITAMINS) 28-0.8 MG TABS Take 1 tablet by mouth daily.     No current facility-administered medications on file prior to visit.      Objective:     Vitals:   04/01/24 1342  BP: 122/80  Pulse: 83   Filed Weights   04/01/24 1342  Weight: 248 lb 4.8 oz (112.6 kg)  Assessment:    [id number] Patient Active Problem List   Diagnosis Date Noted   Tubal ligation desired 02/14/2024    Supervision of other normal pregnancy, antepartum 10/24/2023   Late prenatal care @ approx 15 weeks 10/24/2023   History of marijuana use 10/24/2023   Maternal obesity, antepartum- pregravid BMI= 32 12/05/2022   Blood pressure elevated without history of HTN 08/16/2021   Iron  deficiency anemia 08/10/2021     1. Encounter for consultation for female sterilization   2. [redacted] weeks gestation of pregnancy   3. Supervision of other normal pregnancy, antepartum     Considering permanent sterilization versus other forms of birth control.   Plan:            1.  Tubal We have discussed permanent sterilization in detail.  I have reviewed Filshie clip placement as a means of sterilization.  I have explained the risks and benefits of this procedure in detail.  I have stressed the fact that this is a permanent and not reversible procedure and there is a failure rate of 3 to7 per 1000 which is somewhat timing and method dependent.  I have discussed the possibility of inadvertent damage to bowel, bladder, blood vessels or other internal organs..  I have specifically discussed the risk of anesthesia, infection and blood loss with her.  We have discussed the possibility of laparotomy should there be a problem and the additional possibility of a longer hospital stay.  I have spoken with her regarding the recovery period, informing her that after this procedure, most people are performing their normal daily activities within one week.  I have recommended abstinence or another birth control method for 2-4 weeks following this procedure.  Other options of birth control have also been discussed.  The procedure of sterilization and alternate methods of birth control were discussed in detail with the patient.  I have answered all her questions and I believe that she has an adequate and informed understanding of the procedure. We have discussed the possibility of interval tubal and immediate postpartum tubal risk benefits  of each were reviewed. We have also discussed the possibility of IUD.  The risks and benefits of IUD insertion discussed.  We have specifically discussed Mirena  and its effect on her menstrual periods.  Placement of IUD immediately postpartum as well as at about the 6-week visit was discussed.  All questions answered.  She has decided to consider her options and inform us  in the future which she has decided. If she decides upon permanent sterilization an interval tubal I have asked that she make an appointment for 4 weeks postpartum. Orders No orders of the defined types were placed in this encounter.   No orders of the defined types were placed in this encounter.     F/U  No follow-ups on file.  Delice Felt, M.D. 04/01/2024 2:06 PM

## 2024-04-01 NOTE — Progress Notes (Signed)
 Patient presents today to discuss BTL surgery following delivery. She has signed the Medicaid BTL consent form on 01/17/24 and currently getting OB at [facility]. No additional concerns.

## 2024-04-03 ENCOUNTER — Ambulatory Visit

## 2024-04-04 ENCOUNTER — Ambulatory Visit: Admitting: Nurse Practitioner

## 2024-04-04 ENCOUNTER — Encounter: Payer: Self-pay | Admitting: Nurse Practitioner

## 2024-04-04 VITALS — BP 115/77 | HR 77 | Temp 97.0°F | Wt 248.2 lb

## 2024-04-04 DIAGNOSIS — Z308 Encounter for other contraceptive management: Secondary | ICD-10-CM

## 2024-04-04 DIAGNOSIS — O99013 Anemia complicating pregnancy, third trimester: Secondary | ICD-10-CM

## 2024-04-04 DIAGNOSIS — R030 Elevated blood-pressure reading, without diagnosis of hypertension: Secondary | ICD-10-CM

## 2024-04-04 DIAGNOSIS — O99210 Obesity complicating pregnancy, unspecified trimester: Secondary | ICD-10-CM

## 2024-04-04 DIAGNOSIS — Z3483 Encounter for supervision of other normal pregnancy, third trimester: Secondary | ICD-10-CM

## 2024-04-04 DIAGNOSIS — Z3480 Encounter for supervision of other normal pregnancy, unspecified trimester: Secondary | ICD-10-CM

## 2024-04-04 DIAGNOSIS — O99213 Obesity complicating pregnancy, third trimester: Secondary | ICD-10-CM

## 2024-04-04 DIAGNOSIS — Z3A38 38 weeks gestation of pregnancy: Secondary | ICD-10-CM

## 2024-04-04 NOTE — Progress Notes (Signed)
 [facility] [address] [place] [address] Main phone: [phone]  Prenatal Visit  Subjective:  Janet Roach is a 28 y.o. G3P2002 at [redacted]w[redacted]d being seen today for ongoing prenatal care.  She is currently monitored for the following issues for this low-risk pregnancy:   Patient Active Problem List   Diagnosis Date Noted   Tubal ligation desired 02/14/2024   Supervision of other normal pregnancy, antepartum 10/24/2023   Late prenatal care @ approx 15 weeks 10/24/2023   History of marijuana use 10/24/2023   Maternal obesity, antepartum- pregravid BMI= 32 12/05/2022   Blood pressure elevated without history of HTN 08/16/2021   Iron  deficiency anemia 08/10/2021   Patient reports no complaints.  Contractions: Irregular. Vag. Bleeding: None.  Movement: Present. Denies leaking of fluid/ROM.   The following portions of the patient's history were reviewed and updated as appropriate: allergies, current medications, past family history, past medical history, past social history, past surgical history and problem list. Problem list updated.  Objective:   Vitals:   04/04/24 1022  BP: 115/77  Pulse: 77  Temp: (!) 97 F (36.1 C)  Weight: 248 lb 3.2 oz (112.6 kg)    Fetal Status: Fetal Heart Rate (bpm): 136 Fundal Height: 38 cm Movement: Present  Presentation: Vertex  General:  Alert, oriented and cooperative. Patient is in no acute distress.  Skin: Skin is warm and dry. No rash noted.   Cardiovascular: Normal heart rate noted  Respiratory: Normal respiratory effort, no problems with respiration noted  Abdomen: Soft, gravid, appropriate for gestational age.  Pain/Pressure: Absent     Pelvic: Cervical exam deferred        Extremities: Normal range of motion.  Edema: Mild pitting, slight indentation  Mental Status: Normal mood and affect. Normal behavior. Normal judgment and thought content.   Assessment and  Plan:  Pregnancy: G3P2002 at [redacted]w[redacted]d  1. [redacted] weeks gestation of pregnancy (Primary) RV in 1 week.  2. Supervision of other normal pregnancy, antepartum Reports taking PNV daily.   3. Maternal obesity, antepartum- pregravid BMI= 32 Reports taking ASA daily.  Total Weight Gain: 33 lb 3.2 oz (15.1 kg) 1 lb gain in previous 1 week  Pre-gravid BMI 32.70 obese. Expected weight gain this pregnancy 11-20 pounds.  4. Anemia during pregnancy in third trimester Patient reports taking iron  EOD with citric juice.  Patient encouraged to continue taking PO iron  EOD with citric juice. No further eval needed at this time.   5. Blood pressure elevated without history of HTN BP=115/77 in office today. Reassuring.  Patient denies symptoms/signs of Pre-E.  Plan to continue to monitor weekly until delivery or sooner if indicated.   6. Tubal ligation desired Patient kept BTL consultation on 04/01/24. She reports that she desires to proceed with BTL after delivery. No further management at this time rgarding BTL.   Term labor symptoms and general obstetric precautions including but not limited to vaginal bleeding, contractions, leaking of fluid and fetal movement were reviewed in detail with the patient. Please refer to After Visit Summary for other counseling recommendations.  Return for 39 week prenatal care.  Future Appointments  Date Time Provider Department Center  04/10/2024  9:30 AM AC-MH PROVIDER AC-MAT None  04/24/2024  9:30 AM AC[facility] PROVIDER AC-MAT None  05/08/2024  9:30 AM AC[facility] PROVIDER AC-MAT None  05/22/2024  9:30 AM AC[facility] PROVIDER AC-MAT None  06/05/2024  9:30 AM AC-MH PROVIDER AC-MAT None    Leary Provencal  Caresse Chant, NP

## 2024-04-04 NOTE — Progress Notes (Signed)
 Patient desires BTL. Plans to attend Centering in Pregnancy next week if has not yet delivered. Gery Kubas RN

## 2024-04-05 ENCOUNTER — Encounter: Payer: Self-pay | Admitting: Obstetrics and Gynecology

## 2024-04-05 ENCOUNTER — Inpatient Hospital Stay
Admission: EM | Admit: 2024-04-05 | Discharge: 2024-04-06 | DRG: 807 | Disposition: A | Discharge status: Home / Self Care | Attending: Obstetrics and Gynecology | Admitting: Obstetrics and Gynecology

## 2024-04-05 ENCOUNTER — Other Ambulatory Visit: Payer: Self-pay

## 2024-04-05 ENCOUNTER — Encounter: Payer: Self-pay | Admitting: Oncology

## 2024-04-05 DIAGNOSIS — O9902 Anemia complicating childbirth: Principal | ICD-10-CM

## 2024-04-05 DIAGNOSIS — Z3A39 39 weeks gestation of pregnancy: Secondary | ICD-10-CM

## 2024-04-05 DIAGNOSIS — E669 Obesity, unspecified: Secondary | ICD-10-CM

## 2024-04-05 DIAGNOSIS — O99214 Obesity complicating childbirth: Secondary | ICD-10-CM | POA: Diagnosis not present

## 2024-04-05 DIAGNOSIS — Z3480 Encounter for supervision of other normal pregnancy, unspecified trimester: Secondary | ICD-10-CM

## 2024-04-05 DIAGNOSIS — Z3483 Encounter for supervision of other normal pregnancy, third trimester: Secondary | ICD-10-CM | POA: Diagnosis not present

## 2024-04-05 DIAGNOSIS — Z833 Family history of diabetes mellitus: Secondary | ICD-10-CM | POA: Diagnosis not present

## 2024-04-05 DIAGNOSIS — Z7982 Long term (current) use of aspirin: Secondary | ICD-10-CM

## 2024-04-05 DIAGNOSIS — O99324 Drug use complicating childbirth: Secondary | ICD-10-CM | POA: Diagnosis not present

## 2024-04-05 DIAGNOSIS — D509 Iron deficiency anemia, unspecified: Secondary | ICD-10-CM

## 2024-04-05 DIAGNOSIS — F1291 Cannabis use, unspecified, in remission: Secondary | ICD-10-CM

## 2024-04-05 DIAGNOSIS — Z8249 Family history of ischemic heart disease and other diseases of the circulatory system: Secondary | ICD-10-CM | POA: Diagnosis not present

## 2024-04-05 DIAGNOSIS — Z308 Encounter for other contraceptive management: Secondary | ICD-10-CM

## 2024-04-05 DIAGNOSIS — O99210 Obesity complicating pregnancy, unspecified trimester: Secondary | ICD-10-CM | POA: Diagnosis present

## 2024-04-05 DIAGNOSIS — O26893 Other specified pregnancy related conditions, third trimester: Secondary | ICD-10-CM | POA: Diagnosis not present

## 2024-04-05 LAB — URINE DRUG SCREEN, QUALITATIVE (ARMC ONLY)
Amphetamines, Ur Screen: NOT DETECTED
Barbiturates, Ur Screen: NOT DETECTED
Benzodiazepine, Ur Scrn: NOT DETECTED
Cannabinoid 50 Ng, Ur ~~LOC~~: NOT DETECTED
Cocaine Metabolite,Ur ~~LOC~~: NOT DETECTED
MDMA (Ecstasy)Ur Screen: NOT DETECTED
Methadone Scn, Ur: NOT DETECTED
Opiate, Ur Screen: NOT DETECTED
Phencyclidine (PCP) Ur S: NOT DETECTED
Tricyclic, Ur Screen: NOT DETECTED

## 2024-04-05 LAB — CBC
HCT: 35.1 % — ABNORMAL LOW (ref 36.0–46.0)
Hemoglobin: 11.7 g/dL — ABNORMAL LOW (ref 12.0–15.0)
MCH: 29.8 pg (ref 26.0–34.0)
MCHC: 33.3 g/dL (ref 30.0–36.0)
MCV: 89.3 fL (ref 80.0–100.0)
Platelets: 184 10*3/uL (ref 150–400)
RBC: 3.93 MIL/uL (ref 3.87–5.11)
RDW: 15.5 % (ref 11.5–15.5)
WBC: 14.1 10*3/uL — ABNORMAL HIGH (ref 4.0–10.5)
nRBC: 0 % (ref 0.0–0.2)

## 2024-04-05 LAB — TYPE AND SCREEN
ABO/RH(D): A POS
Antibody Screen: NEGATIVE

## 2024-04-05 MED ORDER — FENTANYL CITRATE (PF) 100 MCG/2ML IJ SOLN
INTRAMUSCULAR | Status: AC
  Administered 2024-04-05: 100 ug via INTRAVENOUS
  Filled 2024-04-05: qty 2

## 2024-04-05 MED ORDER — COCONUT OIL OIL
1.0000 | TOPICAL_OIL | Status: DC | PRN
Start: 2024-04-05 — End: 2024-04-06
  Filled 2024-04-05: qty 7.5

## 2024-04-05 MED ORDER — OXYTOCIN-SODIUM CHLORIDE 30-0.9 UT/500ML-% IV SOLN
2.5000 [IU]/h | INTRAVENOUS | Status: DC
  Filled 2024-04-05: qty 500

## 2024-04-05 MED ORDER — LACTATED RINGERS IV SOLN: INTRAVENOUS | Status: DC

## 2024-04-05 MED ORDER — AMMONIA AROMATIC IN INHA
RESPIRATORY_TRACT | Status: AC
  Filled 2024-04-05: qty 10

## 2024-04-05 MED ORDER — ONDANSETRON HCL 4 MG/2ML IJ SOLN: 4.0000 mg | Freq: Four times a day (QID) | INTRAMUSCULAR | Status: DC | PRN

## 2024-04-05 MED ORDER — DOCUSATE SODIUM 100 MG PO CAPS
100.0000 mg | ORAL_CAPSULE | Freq: Two times a day (BID) | ORAL | Status: DC
  Administered 2024-04-05 – 2024-04-06 (×2): 100 mg via ORAL
  Filled 2024-04-05 (×2): qty 1

## 2024-04-05 MED ORDER — OXYTOCIN 10 UNIT/ML IJ SOLN
INTRAMUSCULAR | Status: AC
  Administered 2024-04-05: 10 [IU] via INTRAMUSCULAR
  Filled 2024-04-05: qty 2

## 2024-04-05 MED ORDER — LIDOCAINE HCL (PF) 1 % IJ SOLN: 30.0000 mL | INTRAMUSCULAR | Status: DC | PRN

## 2024-04-05 MED ORDER — FENTANYL CITRATE (PF) 100 MCG/2ML IJ SOLN: 100.0000 ug | Freq: Once | INTRAMUSCULAR | Status: DC

## 2024-04-05 MED ORDER — OXYTOCIN BOLUS FROM INFUSION: 333.0000 mL | Freq: Once | INTRAVENOUS | Status: DC

## 2024-04-05 MED ORDER — PRENATAL MULTIVITAMIN CH
1.0000 | ORAL_TABLET | Freq: Every day | ORAL | Status: DC
Start: 2024-04-05 — End: 2024-04-06
  Administered 2024-04-05 – 2024-04-06 (×2): 1 via ORAL
  Filled 2024-04-05 (×3): qty 1

## 2024-04-05 MED ORDER — IBUPROFEN 600 MG PO TABS
600.0000 mg | ORAL_TABLET | Freq: Four times a day (QID) | ORAL | Status: DC
  Administered 2024-04-05 – 2024-04-06 (×5): 600 mg via ORAL
  Filled 2024-04-05 (×6): qty 1

## 2024-04-05 MED ORDER — DIPHENHYDRAMINE HCL 25 MG PO CAPS: 25.0000 mg | ORAL_CAPSULE | Freq: Four times a day (QID) | ORAL | Status: DC | PRN

## 2024-04-05 MED ORDER — BENZOCAINE-MENTHOL 20-0.5 % EX AERO
1.0000 | INHALATION_SPRAY | CUTANEOUS | Status: DC | PRN
  Administered 2024-04-05: 1 via TOPICAL
  Filled 2024-04-05: qty 56

## 2024-04-05 MED ORDER — SIMETHICONE 80 MG PO CHEW: 80.0000 mg | CHEWABLE_TABLET | ORAL | Status: DC | PRN

## 2024-04-05 MED ORDER — OXYTOCIN-SODIUM CHLORIDE 30-0.9 UT/500ML-% IV SOLN: 2.5000 [IU]/h | INTRAVENOUS | Status: DC | PRN

## 2024-04-05 MED ORDER — OXYTOCIN-SODIUM CHLORIDE 30-0.9 UT/500ML-% IV SOLN
INTRAVENOUS | Status: AC
  Administered 2024-04-05: 2.5 [IU]/h via INTRAVENOUS
  Filled 2024-04-05: qty 500

## 2024-04-05 MED ORDER — ACETAMINOPHEN 325 MG PO TABS
650.0000 mg | ORAL_TABLET | ORAL | Status: DC | PRN
Start: 2024-04-05 — End: 2024-04-05

## 2024-04-05 MED ORDER — LIDOCAINE HCL (PF) 1 % IJ SOLN
INTRAMUSCULAR | Status: DC
Start: 2024-04-05 — End: 2024-04-05
  Filled 2024-04-05: qty 30

## 2024-04-05 MED ORDER — CEFAZOLIN SODIUM-DEXTROSE 1-4 GM/50ML-% IV SOLN
1.0000 g | Freq: Once | INTRAVENOUS | Status: DC
  Administered 2024-04-05: 1 g via INTRAVENOUS
  Filled 2024-04-05: qty 50

## 2024-04-05 MED ORDER — OXYTOCIN 10 UNIT/ML IJ SOLN: 10.0000 [IU] | Freq: Once | INTRAMUSCULAR | Status: DC

## 2024-04-05 MED ORDER — LACTATED RINGERS IV SOLN
500.0000 mL | INTRAVENOUS | Status: DC | PRN
  Administered 2024-04-05: 1000 mL via INTRAVENOUS

## 2024-04-05 MED ORDER — MISOPROSTOL 200 MCG PO TABS
ORAL_TABLET | ORAL | Status: AC
  Filled 2024-04-05: qty 4

## 2024-04-05 MED ORDER — SOD CITRATE-CITRIC ACID 500-334 MG/5ML PO SOLN: 30.0000 mL | ORAL | Status: DC | PRN

## 2024-04-05 MED ORDER — ACETAMINOPHEN 325 MG PO TABS
650.0000 mg | ORAL_TABLET | ORAL | Status: DC | PRN
  Administered 2024-04-05 (×2): 650 mg via ORAL
  Filled 2024-04-05 (×2): qty 2

## 2024-04-05 NOTE — Lactation Note (Signed)
 This note was copied from a baby's chart. Lactation Consultation Note  Patient Name: Janet Roach Unijb'd Date: 04/05/2024 Age:27 hours Reason for consult: L&D Initial assessment;Term   Maternal Data Has patient been taught Hand Expression?: Yes Does the patient have breastfeeding experience prior to this delivery?: No  Feeding Mother's Current Feeding Choice: Breast Milk Baby had already latched and breastfed on left breast x 15 min with audible swallows per mom, baby rooting on mom's chest, mom shown hand expression and baby easily latched  to right breast in cradle hold, mom shown how to support breast and ensure deep latch LATCH Score Latch: Grasps breast easily, tongue down, lips flanged, rhythmical sucking.  Audible Swallowing: A few with stimulation  Type of Nipple: Everted at rest and after stimulation  Comfort (Breast/Nipple): Soft / non-tender  Hold (Positioning): Assistance needed to correctly position infant at breast and maintain latch.  LATCH Score: 8   Lactation Tools Discussed/Used    Interventions Interventions: Breast feeding basics reviewed;Assisted with latch;Skin to skin;Hand express;Breast compression;Support pillows;Education  Discharge Pump: Personal WIC Program: Yes  Consult Status Consult Status: Follow-up from L&D Date: 04/05/24 Follow-up type: In-patient    [name] 04/05/2024, 2:22 PM

## 2024-04-05 NOTE — Discharge Summary (Signed)
 Postpartum Discharge Summary  Date of Service updated***     Patient Name: Janet Roach DOB: 03/30/1996 MRN: [id number]  Date of admission: 04/05/2024 Delivery date:04/05/2024 Delivering provider: Damen Windsor, LAURAINE PARAS Date of discharge: 04/05/2024  Admitting diagnosis: Labor and delivery, indication for care [O75.9] Normal labor [O80, Z37.9] Intrauterine pregnancy: [redacted]w[redacted]d     Secondary diagnosis:  Principal Problem:   Labor and delivery, indication for care Active Problems:   Iron  deficiency anemia   Maternal obesity, antepartum- pregravid BMI= 32   Supervision of other normal pregnancy, antepartum   History of marijuana use   Tubal ligation desired   Normal labor    Discharge diagnosis: Term Pregnancy Delivered                                              Post partum procedures:{Postpartum procedures:23558} Augmentation: none Complications: avulsed umbilical cord  Hospital course: Onset of Labor With Vaginal Delivery      28 y.o. yo G3P2002 at [redacted]w[redacted]d was admitted in Active Labor on 04/05/2024. Labor course was uncomplicated. During delivery of the placenta, the cord avulsed, see Dr. Keller note for details on placenta delivery. Membrane Rupture Time/Date: 11:55 AM,04/05/2024  Delivery Method:Vaginal, Spontaneous Operative Delivery:N/A Episiotomy: None Lacerations:  None Patient had a postpartum course complicated by ***.  She is ambulating, tolerating a regular diet, passing flatus, and urinating well. Patient is discharged home in stable condition on 04/05/24.  Newborn Data: Birth date:04/05/2024 Birth time:11:55 AM Gender:Female Living status:Living Apgars:8 ,9  Weight:3270 g  Magnesium Sulfate received: No BMZ received: No Rhophylac:N/A MMR:N/A T-DaP:Given prenatally Flu: No Transfusion:{Transfusion received:30440034} Immunizations administered: Immunization History  Administered Date(s) Administered   DTaP 01/22/1997, 03/25/1997, 05/26/1997, 02/24/1998,  11/29/2000   HIB (PRP-OMP) 01/22/1997, 03/25/1997, 05/26/1997, 02/24/1998   HPV Quadrivalent 03/29/2010, 10/04/2010, 11/21/2010   Hepatitis B 02-22-97, 12/23/1996, 08/24/1997   IPV 01/22/1997, 03/25/1997, 11/30/1997, 11/29/2000   Influenza,inj,Quad PF,6+ Mos 09/26/2016   Influenza-Unspecified 07/26/2017   MMR 11/30/1997, 11/29/2000   Tdap 04/24/2007, 05/30/2021, 01/17/2024   Varicella 11/30/1997, 04/24/2007    Physical exam  Vitals:   04/05/24 1321  Weight: 112.6 kg  Height: 5' 8 (1.727 m)   General: {Exam; general:21111117} Lochia: {Desc; appropriate/inappropriate:30686::appropriate} Uterine Fundus: {Desc; firm/soft:30687} Incision: {Exam; incision:21111123} DVT Evaluation: {Exam; icu:7888877} Labs: Lab Results  Component Value Date   WBC 9.8 10/24/2023   HGB 12.5 10/24/2023   HCT 37.9 10/24/2023   MCV 87 10/24/2023   PLT 230 10/24/2023      Latest Ref Rng & Units 10/24/2023   10:13 AM  CMP  Glucose 70 - 99 mg/dL 84   BUN 6 - 20 mg/dL 8   Creatinine 9.42 - 8.99 mg/dL 9.35   Sodium 865 - 855 mmol/L 135   Potassium 3.5 - 5.2 mmol/L 4.2   Chloride 96 - 106 mmol/L 102   CO2 20 - 29 mmol/L 22   Calcium 8.7 - 10.2 mg/dL 9.1   Total Protein 6.0 - 8.5 g/dL 6.1   Total Bilirubin 0.0 - 1.2 mg/dL <9.7   Alkaline Phos 44 - 121 IU/L 49   AST 0 - 40 IU/L 13   ALT 0 - 32 IU/L 9    Edinburgh Score:    02/14/2024    9:51 AM  Edinburgh Postnatal Depression Scale Screening Tool  I have been able to laugh and see the funny  side of things. 0  I have looked forward with enjoyment to things. 0  I have blamed myself unnecessarily when things went wrong. 0  I have been anxious or worried for no good reason. 0  I have felt scared or panicky for no good reason. 0  Things have been getting on top of me. 0  I have been so unhappy that I have had difficulty sleeping. 0  I have felt sad or miserable. 0  I have been so unhappy that I have been crying. 0  The thought of harming  myself has occurred to me. 0  Edinburgh Postnatal Depression Scale Total 0      Data saved with a previous flowsheet row definition      After visit meds:  Allergies as of 04/05/2024   No Known Allergies   Med Rec must be completed prior to using this [facility]***        Discharge home in stable condition Infant Feeding: {Baby feeding:23562} Infant Disposition:{CHL IP OB HOME WITH FNUYZM:76418} Discharge instruction: per After Visit Summary and Postpartum booklet. Activity: Advance as tolerated. Pelvic rest for 6 weeks.  Diet: {OB diet:21111121} Anticipated Birth Control: BTL done PP Postpartum Appointment:6 weeks Additional Postpartum F/U: two weeks for BTL scheduling Future Appointments: Future Appointments  Date Time Provider Department Center  04/10/2024  9:30 AM AC-MH PROVIDER AC-MAT None  04/24/2024  9:30 AM AC-MH PROVIDER AC-MAT None  05/08/2024  9:30 AM AC-MH PROVIDER AC-MAT None  05/22/2024  9:30 AM AC-MH PROVIDER AC-MAT None  06/05/2024  9:30 AM AC-MH PROVIDER AC-MAT None   Follow up Visit:  Follow-up Information     [facility]. Schedule an appointment as soon as possible for a visit.   Specialty: Obstetrics and Gynecology Why: In two weeks with MD for interval  BTL planning Contact information: [address] [place]  [address] [phone]                    04/05/2024 Lauraine PARAS Andres Escandon, CNM

## 2024-04-05 NOTE — Progress Notes (Signed)
 Called by CNM to assist with delivery of placenta.  Patient arrived at hospital and precipitously delivered a viable and vigorous female infant Zora).  Patient received IM pitocin  10 units due to lack of IV access. On trying removed placenta, CNM experienced a cord avulsion and she was unable to locate the end of the cord.  I arrived on the unit. The patient had received fentanyl  100 mcg IV (by this time she had an IV and was receiving IV fluids).  I discussed with the patient my plan.  On exam, I could palpate the edge of the placenta  in the cervical os (the rounded part of the placenta rather than the edge).  No cord segment palpable and no obvious edge of placenta to grasp.  She was having strong contractions/cramps. With fundal massage placenta delivery spontaneously and intact with a 3 vessel cord.  Lochia normal with fundal massage.  EBL about 100 mL See delivery note for details of delivery of infant.    Garnette Mace, MD, FACOG 04/05/2024 12:58 PM

## 2024-04-05 NOTE — H&P (Signed)
 [facility] Labor & Delivery  History and Physical  ASSESSMENT AND PLAN   Janet Roach is a 28 y.o. G3P2002 at [redacted]w[redacted]d with EDD: 04/12/2024, by LMP confirmed by 20 week ultrasound, admitted in active labor.  Labor plan - Patient presented to triage completely dilated with a bulging BOW and was immediately moved to L&D room pending SVD.   Fetal Status: - cephalic presentation by SVE - FHTs obtained WNL (125 bpm), unable to start continuous monitoring due to patient's advanced stage of labor.   Other Active Problems: Anemia - Hgb 10.6 on 6/12. Admission CBC pending.   Hx of Marijuana use - Last use in July 2024. Discussed UDS with patient which she agrees to.   Labs/Immunizations: Prenatal labs and studies: ABO, Rh: A/Positive/-- (01/08 1013) Antibody: Negative (01/08 1013) Rubella: 12.90 (01/08 1013) Varicella:   unknown RPR: Non Reactive (04/03 1115)  HBsAg: Negative (01/08 1013)  HepC: Non Reactive (01/08 1013) HIV: Non Reactive (01/08 1013)  GBS: Negative/-- (06/03 1530)   TDAP: given prenatally Flu: no  Postpartum Plan: - Feeding: Breast Milk - Contraception: plans Postpartum BTL-interval, to be performed by A[facility] - Prenatal Care Provider: [facility]   HPI   Chief Complaint: Contractions  Janet Roach is a 28 y.o. G3P2002 at 109w0d who presents for evaluation of labor. She states she started contracting overnight and contractions became more regular around 3 am. She denies vaginal bleeding or leakage of fluid.    Pregnancy Complications Patient Active Problem List   Diagnosis Date Noted   Labor and delivery, indication for care 04/05/2024   Normal labor 04/05/2024   Tubal ligation desired 02/14/2024   Supervision of other normal pregnancy, antepartum 10/24/2023   Late prenatal care @ approx 15 weeks 10/24/2023   History of marijuana use 10/24/2023   Maternal obesity, antepartum- pregravid BMI= 32 12/05/2022   Blood pressure  elevated without history of HTN 08/16/2021   Iron  deficiency anemia 08/10/2021    Review of Systems A twelve point review of systems was negative except as stated in HPI.   HISTORY   Medications Medications Prior to Admission  Medication Sig Dispense Refill Last Dose/Taking   aspirin  EC 81 MG tablet Take 1 tablet (81 mg total) by mouth daily. Swallow whole.   04/04/2024   Iron , Ferrous Sulfate , 325 (65 Fe) MG TABS Take 325 mg by mouth in the morning and at bedtime.   Past Week   Prenatal Vit-Fe Fumarate-FA (PRENATAL VITAMINS) 28-0.8 MG TABS Take 1 tablet by mouth daily.   04/04/2024    Allergies has no known allergies.   OB History OB History  Gravida Para Term Preterm AB Living  3 2 2  0 0 2  SAB IAB Ectopic Multiple Live Births  0 0 0 0 2    # Outcome Date GA Lbr Len/2nd Weight Sex Type Anes PTL Lv  3 Current           2 Term 08/24/21 [redacted]w[redacted]d / 01:00 3640 g F Vag-Spont EPI  LIV     Name: DJUNA, FRECHETTE     Apgar1: 8  Apgar5: 9  1 Term 01/11/18 [redacted]w[redacted]d / 00:35 3400 g M Vag-Spont EPI  LIV     Name: CARDINAL TINA ALFONSO     Apgar1: 8  Apgar5: 9    Past Medical History Past Medical History:  Diagnosis Date   Anemia    states hx anemia during pregnancy #1   History of marijuana use 10/24/2023   Patient  denies medical problems    Pica ice 2-3 c/day 06/27/2021   Tubal ligation desired 02/14/2024    Past Surgical History Past Surgical History:  Procedure Laterality Date   EYE SURGERY     states age 60 or 57   STRABISMUS SURGERY     WISDOM TOOTH EXTRACTION Bilateral 08/2022    Social History  reports that she has never smoked. She has never been exposed to tobacco smoke. She has never used smokeless tobacco. She reports that she does not currently use alcohol. She reports that she does not currently use drugs after having used the following drugs: Marijuana.   Family History family history includes Bone cancer in her maternal uncle; Breast cancer in her paternal aunt;  Cancer in her paternal grandfather and paternal grandmother; Dementia in her maternal grandmother; Diabetes in her maternal grandmother; Heart disease in her maternal grandmother; Hypertension in her father, maternal grandmother, and mother; Miscarriages / Stillbirths in her maternal grandmother; Multiple births in her maternal aunt and maternal grandmother; Prostate cancer in her paternal uncle; Thyroid  disease in her maternal aunt.   PHYSICAL EXAM   Vitals:   04/05/24 1321  Weight: 112.6 kg  Height: 5' 8 (1.727 m)    Constitutional: No acute distress, well appearing, and well nourished. Neurologic: She is alert and conversational.  Psychiatric: She has a normal mood and affect.  Musculoskeletal: Normal gait, grossly normal range of motion Cardiovascular: Normal rate.   Pulmonary/Chest: Normal work of breathing.  Gastrointestinal/Abdominal: Soft. Gravid. There is no tenderness.  Skin: Skin is warm and dry. No rash noted.  Genitourinary: Normal external female genitalia.  SVE:  10/100/+2, BBOW   SSE: deferred  Unable to obtain NST due to patient's advanced labor.   Attending Dr. Dottie was immediately available for the care of the patient.   Lauraine PARAS Roby Spalla, CNM

## 2024-04-05 NOTE — OB Triage Note (Signed)
 Patient arrived in triage with c/o contractions. Called RN to bedside while pt sitting on toilet attempting to leave urine sample. Patient reports I feel like I gotta poop. Sarah Free CNM notified of patient's arrival. Patient assisted back to bed.

## 2024-04-05 NOTE — Lactation Note (Signed)
 This note was copied from a baby's chart. Lactation Consultation Note  Patient Name: Janet Roach Date: 04/05/2024 Age:27 hours Reason for consult: Follow-up assessment;Term   Maternal Data Has patient been taught Hand Expression?: Yes Does the patient have breastfeeding experience prior to this delivery?: No  Feeding Mother's Current Feeding Choice: Breast Milk Baby had latched to left breast in football hold and nursed 5 min and mom stated she had fell asleep, encouraged offering right breast, mom latched baby to right breast, after hand expression of few drops of cl colostrum, in football hold, mom shown how to sandwich breast to get a deeper latch, baby is eager and latches to tip of nipple, mom encouraged to stimulate baby while nursing to encourage  baby to continue nursing and not fall asleep   LATCH Score Latch: Grasps breast easily, tongue down, lips flanged, rhythmical sucking.  Audible Swallowing: A few with stimulation  Type of Nipple: Everted at rest and after stimulation  Comfort (Breast/Nipple): Soft / non-tender  Hold (Positioning): Assistance needed to correctly position infant at breast and maintain latch.  LATCH Score: 8   Lactation Tools Discussed/Used    Interventions Interventions: Assisted with latch;Skin to skin;Support pillows LC name and no written on white board Discharge Pump: Personal WIC Program: Yes  Consult Status Consult Status: Follow-up Date: 04/06/24 Follow-up type: In-patient    Janet Roach 04/05/2024, 5:40 PM

## 2024-04-06 ENCOUNTER — Encounter: Payer: Self-pay | Admitting: Oncology

## 2024-04-06 LAB — RPR: RPR Ser Ql: NONREACTIVE

## 2024-04-06 NOTE — Lactation Note (Signed)
 This note was copied from a baby's chart. Lactation Consultation Note  Patient Name: Janet Roach Unijb'd Date: 04/06/2024 Age:27 hours  Reason for consult: Follow-up, term   Maternal Data  This is mom's 3rd baby, SVD. Mom with history of anemia in pregnancy. Mom with history of MJ use in July 2024. Mom has not breastfed her previous children.  On follow-up mom reports baby is not latching to her left breast so she began supplementing baby with formula. Per mom she has a DEBP at home and plans to pump and supplement as needed. LC assisted mom with breastfeeding at her left breast. LC measured mom for correct flange fit.  Feeding  Feeding choice on admission: Breastmilk  Provided mom with tips and strategies to maximize position and latch techniques. Baby latched well, swallows noted by mom and LC. Baby content after feeding. Mom was able to independently position and latch baby to her left breast.  LATCH Score= 9 (see flow sheet)    Lactation Tools Discussed/Used  Harmony manual Pump: use, cleaning of parts,breastmilk storage guidelines, assisted mom with breast shield sizing.  Interventions  Breastfeeding basics, assisted with latch, support pillows, position options, coconut oil, Harmony pump, what to expect in first days when breastfeeding: feeding cues, 8-12 feeds in 24 hours, how to know the baby is getting enough, cluster feeding, how to wake a sleepy baby. Support and encouragement provided to mom who is breastfeeding for the first time. Discharge Mom has a Personal pump(electric) for home use.  Breast engorgement management and breast care When to call the baby's Pediatrician in respect to feeding Outpatient lactation referral  Consult Status  Complete In-patient 04/06/24    Janet Roach 04/06/2024, 3:32 PM

## 2024-04-06 NOTE — Discharge Instructions (Signed)

## 2024-04-06 NOTE — Plan of Care (Signed)
 Patient discharged home with family.  Discharge instructions, when to follow up, and prescriptions reviewed with patient.  Patient verbalized understanding. Patient will be escorted out by auxiliary.   Elyn Sharps, RN 04/06/24 @1447 

## 2024-04-09 ENCOUNTER — Ambulatory Visit: Payer: Self-pay

## 2024-04-10 ENCOUNTER — Ambulatory Visit

## 2024-04-11 ENCOUNTER — Encounter: Payer: Self-pay | Admitting: Oncology

## 2024-04-24 ENCOUNTER — Ambulatory Visit

## 2024-05-08 ENCOUNTER — Ambulatory Visit

## 2024-05-15 ENCOUNTER — Encounter: Payer: Self-pay | Admitting: Oncology

## 2024-05-20 ENCOUNTER — Encounter: Payer: Self-pay | Admitting: Obstetrics and Gynecology

## 2024-05-20 ENCOUNTER — Ambulatory Visit: Admitting: Obstetrics and Gynecology

## 2024-05-20 VITALS — BP 110/77 | HR 54 | Ht 68.0 in | Wt 223.1 lb

## 2024-05-20 DIAGNOSIS — Z01818 Encounter for other preprocedural examination: Secondary | ICD-10-CM | POA: Diagnosis not present

## 2024-05-20 DIAGNOSIS — Z3009 Encounter for other general counseling and advice on contraception: Secondary | ICD-10-CM

## 2024-05-20 NOTE — H&P (Signed)
 PRE-OPERATIVE HISTORY AND PHYSICAL EXAM  PCP:  Pcp, No Subjective:   HPI:  Janet Roach is a 28 y.o. [phone].  No LMP recorded.  She presents today for a pre-op discussion and PE.  She has the following symptoms: Desires permanent sterilization.  Review of Systems:   Constitutional: Denied constitutional symptoms, night sweats, recent illness, fatigue, fever, insomnia and weight loss.  Eyes: Denied eye symptoms, eye pain, photophobia, vision change and visual disturbance.  Ears/Nose/Throat/Neck: Denied ear, nose, throat or neck symptoms, hearing loss, nasal discharge, sinus congestion and sore throat.  Cardiovascular: Denied cardiovascular symptoms, arrhythmia, chest pain/pressure, edema, exercise intolerance, orthopnea and palpitations.  Respiratory: Denied pulmonary symptoms, asthma, pleuritic pain, productive sputum, cough, dyspnea and wheezing.  Gastrointestinal: Denied, gastro-esophageal reflux, melena, nausea and vomiting.  Genitourinary: Denied genitourinary symptoms including symptomatic vaginal discharge, pelvic relaxation issues, and urinary complaints.  Musculoskeletal: Denied musculoskeletal symptoms, stiffness, swelling, muscle weakness and myalgia.  Dermatologic: Denied dermatology symptoms, rash and scar.  Neurologic: Denied neurology symptoms, dizziness, headache, neck pain and syncope.  Psychiatric: Denied psychiatric symptoms, anxiety and depression.  Endocrine: Denied endocrine symptoms including hot flashes and night sweats.   OB History  Gravida Para Term Preterm AB Living  3 3 3  0 0 3  SAB IAB Ectopic Multiple Live Births  0 0 0 0 3    # Outcome Date GA Lbr Len/2nd Weight Sex Type Anes PTL Lv  3 Term 04/05/24 [redacted]w[redacted]d 16:50 / 00:05 7 lb 3.3 oz (3.27 kg) F Vag-Spont None  LIV  2 Term 08/24/21 [redacted]w[redacted]d / 01:00 8 lb 0.4 oz (3.64 kg) F Vag-Spont EPI  LIV  1 Term 01/11/18 [redacted]w[redacted]d / 00:35 7 lb 7.9 oz (3.4 kg) M Vag-Spont EPI  LIV    Past Medical History:   Diagnosis Date   Anemia    states hx anemia during pregnancy #1   History of marijuana use 10/24/2023   Patient denies medical problems    Pica ice 2-3 c/day 06/27/2021   Tubal ligation desired 02/14/2024    Past Surgical History:  Procedure Laterality Date   EYE SURGERY     states age 71 or 36   STRABISMUS SURGERY     WISDOM TOOTH EXTRACTION Bilateral 08/2022      SOCIAL HISTORY:  Social History   Tobacco Use  Smoking Status Never   Passive exposure: Never  Smokeless Tobacco Never  Tobacco Comments   denies seconehand smoke    Social History   Substance and Sexual Activity  Alcohol Use Not Currently   Comment: I don't drink    Social History   Substance and Sexual Activity  Drug Use Not Currently   Types: Marijuana   Comment: last use 01/2023    Family History  Problem Relation Age of Onset   Hypertension Mother    Hypertension Father    Diabetes Maternal Grandmother    Hypertension Maternal Grandmother    Dementia Maternal Grandmother    Heart disease Maternal Grandmother        states heart attack and had surgery   Miscarriages / Stillbirths Maternal Grandmother        had a stillbirth per pt   Multiple births Maternal Grandmother    Cancer Paternal Grandmother        breast   Cancer Paternal Grandfather    Bone cancer Maternal Uncle    Breast cancer Paternal Aunt    Prostate cancer Paternal Uncle    Thyroid  disease  Maternal Aunt    Multiple births Maternal Aunt     ALLERGIES:  Patient has no known allergies.  MEDS:   Current Outpatient Medications on File Prior to Visit  Medication Sig Dispense Refill   Prenatal Vit-Fe Fumarate-FA (PRENATAL VITAMINS) 28-0.8 MG TABS Take 1 tablet by mouth daily.     No current facility-administered medications on file prior to visit.    No orders of the defined types were placed in this encounter.    Physical examination BP 110/77   Pulse (!) 54   Ht 5' 8 (1.727 m)   Wt 223 lb 1.6 oz (101.2 kg)    Breastfeeding Yes   BMI 33.92 kg/m   General NAD, Conversant  HEENT Atraumatic; Op clear with mmm.  Normo-cephalic.  Anicteric sclerae  Thyroid /Neck Smooth without nodularity or enlargement. Normal ROM.  Neck Supple.  Skin No rashes, lesions or ulceration. Normal palpated skin turgor. No nodularity.  Breasts: No masses or discharge.  Symmetric.  No axillary adenopathy.  Lungs: Clear to auscultation.No rales or wheezes. Normal Respiratory effort, no retractions.  Heart: NSR.  No murmurs or rubs appreciated. No peripheral edema  Abdomen: Soft.  Non-tender.  No masses.  No HSM. No hernia  Extremities: Moves all appropriately.  Normal ROM for age. No lymphadenopathy.  Neuro: Oriented to PPT.  Normal mood. Normal affect.     Pelvic:   Vulva: Normal appearance.  No lesions.  Vagina: No lesions or abnormalities noted.  Support: Normal pelvic support.  Urethra No masses tenderness or scarring.  Meatus Normal size without lesions or prolapse.  Cervix: Normal ectropion.  No lesions.  Anus: Normal exam.  No lesions.  Perineum: Normal exam.  No lesions.        Bimanual   Uterus: Normal size.  Non-tender.  Mobile.  AV.  Adnexae: No masses.  Non-tender to palpation.  Cul-de-sac: Negative for abnormality.   Assessment:   G3P3003 Patient Active Problem List   Diagnosis Date Noted   Labor and delivery, indication for care 04/05/2024   Normal labor 04/05/2024   Tubal ligation desired 02/14/2024   Supervision of other normal pregnancy, antepartum 10/24/2023   Late prenatal care @ approx 15 weeks 10/24/2023   History of marijuana use 10/24/2023   Maternal obesity, antepartum- pregravid BMI= 32 12/05/2022   Blood pressure elevated without history of HTN 08/16/2021   Iron  deficiency anemia 08/10/2021    1. Pre-op exam   2. Encounter for consultation for female sterilization      Plan:   Orders: No orders of the defined types were placed in this encounter.    1.  Bilateral tubal  occlusion using Filshie clips

## 2024-05-20 NOTE — Progress Notes (Signed)
 PRE-OPERATIVE HISTORY AND PHYSICAL EXAM  PCP:  Pcp, No Subjective:   HPI:  Janet Roach is a 28 y.o. [phone].  No LMP recorded.  She presents today for a pre-op discussion and PE.  She has the following symptoms: Desires permanent sterilization.  Review of Systems:   Constitutional: Denied constitutional symptoms, night sweats, recent illness, fatigue, fever, insomnia and weight loss.  Eyes: Denied eye symptoms, eye pain, photophobia, vision change and visual disturbance.  Ears/Nose/Throat/Neck: Denied ear, nose, throat or neck symptoms, hearing loss, nasal discharge, sinus congestion and sore throat.  Cardiovascular: Denied cardiovascular symptoms, arrhythmia, chest pain/pressure, edema, exercise intolerance, orthopnea and palpitations.  Respiratory: Denied pulmonary symptoms, asthma, pleuritic pain, productive sputum, cough, dyspnea and wheezing.  Gastrointestinal: Denied, gastro-esophageal reflux, melena, nausea and vomiting.  Genitourinary: Denied genitourinary symptoms including symptomatic vaginal discharge, pelvic relaxation issues, and urinary complaints.  Musculoskeletal: Denied musculoskeletal symptoms, stiffness, swelling, muscle weakness and myalgia.  Dermatologic: Denied dermatology symptoms, rash and scar.  Neurologic: Denied neurology symptoms, dizziness, headache, neck pain and syncope.  Psychiatric: Denied psychiatric symptoms, anxiety and depression.  Endocrine: Denied endocrine symptoms including hot flashes and night sweats.   OB History  Gravida Para Term Preterm AB Living  3 3 3  0 0 3  SAB IAB Ectopic Multiple Live Births  0 0 0 0 3    # Outcome Date GA Lbr Len/2nd Weight Sex Type Anes PTL Lv  3 Term 04/05/24 [redacted]w[redacted]d 16:50 / 00:05 7 lb 3.3 oz (3.27 kg) F Vag-Spont None  LIV  2 Term 08/24/21 [redacted]w[redacted]d / 01:00 8 lb 0.4 oz (3.64 kg) F Vag-Spont EPI  LIV  1 Term 01/11/18 [redacted]w[redacted]d / 00:35 7 lb 7.9 oz (3.4 kg) M Vag-Spont EPI  LIV    Past Medical History:   Diagnosis Date   Anemia    states hx anemia during pregnancy #1   History of marijuana use 10/24/2023   Patient denies medical problems    Pica ice 2-3 c/day 06/27/2021   Tubal ligation desired 02/14/2024    Past Surgical History:  Procedure Laterality Date   EYE SURGERY     states age 26 or 4   STRABISMUS SURGERY     WISDOM TOOTH EXTRACTION Bilateral 08/2022      SOCIAL HISTORY:  Social History   Tobacco Use  Smoking Status Never   Passive exposure: Never  Smokeless Tobacco Never  Tobacco Comments   denies seconehand smoke    Social History   Substance and Sexual Activity  Alcohol Use Not Currently   Comment: I don't drink    Social History   Substance and Sexual Activity  Drug Use Not Currently   Types: Marijuana   Comment: last use 01/2023    Family History  Problem Relation Age of Onset   Hypertension Mother    Hypertension Father    Diabetes Maternal Grandmother    Hypertension Maternal Grandmother    Dementia Maternal Grandmother    Heart disease Maternal Grandmother        states heart attack and had surgery   Miscarriages / Stillbirths Maternal Grandmother        had a stillbirth per pt   Multiple births Maternal Grandmother    Cancer Paternal Grandmother        breast   Cancer Paternal Grandfather    Bone cancer Maternal Uncle    Breast cancer Paternal Aunt    Prostate cancer Paternal Uncle    Thyroid  disease  Maternal Aunt    Multiple births Maternal Aunt     ALLERGIES:  Patient has no known allergies.  MEDS:   Current Outpatient Medications on File Prior to Visit  Medication Sig Dispense Refill   Prenatal Vit-Fe Fumarate-FA (PRENATAL VITAMINS) 28-0.8 MG TABS Take 1 tablet by mouth daily.     No current facility-administered medications on file prior to visit.    No orders of the defined types were placed in this encounter.    Physical examination BP 110/77   Pulse (!) 54   Ht 5' 8 (1.727 m)   Wt 223 lb 1.6 oz (101.2 kg)    Breastfeeding Yes   BMI 33.92 kg/m   General NAD, Conversant  HEENT Atraumatic; Op clear with mmm.  Normo-cephalic.  Anicteric sclerae  Thyroid /Neck Smooth without nodularity or enlargement. Normal ROM.  Neck Supple.  Skin No rashes, lesions or ulceration. Normal palpated skin turgor. No nodularity.  Breasts: No masses or discharge.  Symmetric.  No axillary adenopathy.  Lungs: Clear to auscultation.No rales or wheezes. Normal Respiratory effort, no retractions.  Heart: NSR.  No murmurs or rubs appreciated. No peripheral edema  Abdomen: Soft.  Non-tender.  No masses.  No HSM. No hernia  Extremities: Moves all appropriately.  Normal ROM for age. No lymphadenopathy.  Neuro: Oriented to PPT.  Normal mood. Normal affect.     Pelvic:   Vulva: Normal appearance.  No lesions.  Vagina: No lesions or abnormalities noted.  Support: Normal pelvic support.  Urethra No masses tenderness or scarring.  Meatus Normal size without lesions or prolapse.  Cervix: Normal ectropion.  No lesions.  Anus: Normal exam.  No lesions.  Perineum: Normal exam.  No lesions.        Bimanual   Uterus: Normal size.  Non-tender.  Mobile.  AV.  Adnexae: No masses.  Non-tender to palpation.  Cul-de-sac: Negative for abnormality.   Assessment:   G3P3003 Patient Active Problem List   Diagnosis Date Noted   Labor and delivery, indication for care 04/05/2024   Normal labor 04/05/2024   Tubal ligation desired 02/14/2024   Supervision of other normal pregnancy, antepartum 10/24/2023   Late prenatal care @ approx 15 weeks 10/24/2023   History of marijuana use 10/24/2023   Maternal obesity, antepartum- pregravid BMI= 32 12/05/2022   Blood pressure elevated without history of HTN 08/16/2021   Iron  deficiency anemia 08/10/2021    1. Pre-op exam   2. Encounter for consultation for female sterilization      Plan:   Orders: No orders of the defined types were placed in this encounter.    1.  Bilateral tubal  occlusion using Filshie clips  Pre-op discussions regarding Risks and Benefits of her scheduled surgery.  Tubal We have discussed permanent sterilization in detail.  I have reviewed Filshie clip placement as a means of sterilization.  I have explained the risks and benefits of this procedure in detail.  I have stressed the fact that this is a permanent and not reversible procedure and there is a failure rate of 3 to7 per 1000 which is somewhat timing and method dependent.  I have discussed the possibility of inadvertent damage to bowel, bladder, blood vessels or other internal organs..  I have specifically discussed the risk of anesthesia, infection and blood loss with her.  We have discussed the possibility of laparotomy should there be a problem and the additional possibility of a longer hospital stay.  I have spoken with her regarding the recovery  period, informing her that after this procedure, most people are performing their normal daily activities within one week.  I have recommended abstinence or another birth control method for 2-4 weeks following this procedure.  Other options of birth control have also been discussed.  The procedure of sterilization and alternate methods of birth control were discussed in detail with the patient.  I have answered all her questions and I believe that she has an adequate and informed understanding of the procedure.   Alm DOROTHA Sar, M.D. 05/20/2024 1:39 PM

## 2024-05-20 NOTE — Progress Notes (Signed)
Patient presents today for a pre-op exam prior to BTL. She states no additional concerns today.

## 2024-05-21 ENCOUNTER — Ambulatory Visit: Admitting: Nurse Practitioner

## 2024-05-21 ENCOUNTER — Encounter: Payer: Self-pay | Admitting: Nurse Practitioner

## 2024-05-21 DIAGNOSIS — Z3009 Encounter for other general counseling and advice on contraception: Secondary | ICD-10-CM | POA: Diagnosis not present

## 2024-05-21 LAB — HEMOGLOBIN, FINGERSTICK: Hemoglobin: 13.7 g/dL (ref 11.1–15.9)

## 2024-05-21 NOTE — Progress Notes (Signed)
 [facility] [address], [address] [place] [address] Main phone: [phone]  Postpartum Visit  Subjective:  Janet Roach is a 28 y.o. [phone] female who presents for a postpartum visit. She is 6 weeks postpartum following a normal spontaneous vaginal delivery.  I have fully reviewed the prenatal and intrapartum course. Postpartum course has been good.   Patient Active Problem List   Diagnosis Date Noted   Tubal ligation desired 02/14/2024   History of marijuana use 10/24/2023   Blood pressure elevated without history of HTN 08/16/2021   Iron  deficiency anemia 08/10/2021    Delivery The delivery was at 39 gestational weeks.   Anesthesia: none.  Delivery complications: cord avulsion during removal of placenta Patient describes her labor and delivery as fast, good. Feels midwife tugging on placenta was unnecessary instead of letting it come on it's own, but overall feels good about her birth. States she didn't really realize she was in labor, was fully dilated when she got to L&D.   Infant Baby is doing well.  Baby is feeding by breast.  GI & GU Bleeding: no bleeding.  Bowel function is normal.  Bladder function is normal. Some urine leakage with sneezing.   Sexuality and Contraception Patient is not sexually active.  Contraception method is abstinence. Has BTL scheduled for 06/13/24.  The pregnancy intention screening data noted above was reviewed. Potential methods of contraception were discussed. The patient elected to proceed with Abstinence.  Mood Postpartum depression screening: negative. Flowsheet Row Routine Prenatal from [facility]/18/2024 in [facility] Department  PHQ-9 Total Score 2    Health Maintenance Health Maintenance Due  Topic Date Due   COVID-19 Vaccine (1) Never done   INFLUENZA VACCINE  05/16/2024   Review of Systems Pertinent items are noted in HPI.  Objective:  BP  121/78 (BP Location: Right Arm, Patient Position: Sitting)   Wt 225 lb (102.1 kg)   LMP  (LMP Unknown) Comment: no period since birth of baby  Breastfeeding Yes   BMI 34.21 kg/m    General:  alert, cooperative, and appears stated age   Breasts:  not indicated  Lungs: clear to auscultation bilaterally  Heart:  regular rate and rhythm, S1, S2 normal, no murmur, click, rub or gallop  Abdomen: soft, non-tender; bowel sounds normal; no masses,  no organomegaly. Fingertip separation of rectus abdominis muscles.  Wound N/a  GU exam:  not indicated       Assessment:   1. Postpartum care following vaginal delivery (Primary) - Doing great postpartum. No concerns. Exclusively breastfeeding and doing fantastic. Bonding well with baby. Excellent family support. No pain, no bladder and bowel issues, good appetite. Doing some walking for exercise. -Occasional urine leakage when sneezing. Discussed Kegel exercises. Offered pelvic floor PT referral, patient declined at this time.  2. Family planning -BTL scheduled for 06/13/24, will be abstinent until then. - Hemoglobin, fingerstick   Normal postpartum exam.   Plan:   Essential components of care per ACOG recommendations:  1.  Mood and well being: Patient with negative depression screening today. Reviewed local resources for support.  - Patient tobacco use? No.   - hx of drug use? MJ  2. Infant care and feeding:  -Patient currently breastmilk feeding? Yes. Reviewed importance of draining breast regularly to support lactation. Discussed breastfeeding positions to help baby manage fast milk flow.  -Social determinants of health (SDOH) reviewed in EPIC. No concerns.  3. Sexuality, contraception and birth  spacing - Patient does not want a pregnancy in the next year.  Desired family size is 3 children.  - Reviewed reproductive life planning. Reviewed options based on patient desire and reproductive life plan. Patient is interested in BTL, which she  has scheduled.  Emergency Contraception Precautions (ECP): Patient assessed for need of ECP. She is not a candidate based on no intercourse since delivery.   - Discussed birth spacing of 18 months  4. Sleep and fatigue -Encouraged family/partner/community support of 4 hrs of uninterrupted sleep to help with mood and fatigue  5. Physical Recovery  - Discussed patients delivery and complications. She describes her labor as good. - Patient had a Vaginal, no problems at delivery. Patient had no laceration.  - Patient has urinary incontinence? Yes. Offered PT and patient declined. Leakage is minimal/when sneezing. - Patient is safe to resume physical and sexual activity  6.  Health Maintenance - HM due items addressed Yes - Last pap smear:  No results found for: DIAGPAP, HPVHIGH, ADEQPAP Lab Results  Component Value Date   SPECADGYN Comment 10/24/2023   Result Date Procedure Results Follow-ups  10/24/2023 IGP, rfx Aptima HPV ASCU DIAGNOSIS:: Comment Specimen adequacy:: Comment Clinician Provided ICD10: Comment Performed by:: Comment PAP Smear Comment: . Note:: Comment Test Methodology: Comment PAP Reflex: Comment   01/31/2021 Pap IG (Image Guided) DIAGNOSIS:: Comment Specimen adequacy:: Comment Clinician Provided ICD10: Comment Performed by:: Comment PAP Smear Comment: . Note:: Comment Test Methodology: Comment   02/18/2018 HM PAP SMEAR HM Pap smear: Negative    - Pap smear not done at today's visit. Done 10/24/23  -Breast Cancer screening indicated? No.   7. Chronic Disease/Pregnancy Condition follow up: Anemia and discussed obtaining a PCP for general check ups, blood pressure follow up given high BP readings during this pregnancy. Hemoglobin today 13.1. Encouraged her to continue prenatal vitamin while nursing.  - PCP follow up. PCP list provided.  Damien FORBES Satchel [facility] Department

## 2024-05-21 NOTE — Progress Notes (Addendum)
 Here for PP exam, has BTL scheduled 06/13/24. SABRAIzetta Parish, RN   Hgb= 13.7 today.Hulan Parish, RN

## 2024-05-22 ENCOUNTER — Ambulatory Visit

## 2024-05-22 ENCOUNTER — Ambulatory Visit: Payer: Self-pay

## 2024-06-05 ENCOUNTER — Ambulatory Visit

## 2024-06-06 ENCOUNTER — Encounter
Admission: RE | Admit: 2024-06-06 | Discharge: 2024-06-06 | Disposition: A | Discharge status: Home / Self Care | Source: Ambulatory Visit | Attending: Obstetrics and Gynecology | Admitting: Obstetrics and Gynecology

## 2024-06-06 ENCOUNTER — Other Ambulatory Visit: Payer: Self-pay

## 2024-06-06 DIAGNOSIS — Z01812 Encounter for preprocedural laboratory examination: Secondary | ICD-10-CM

## 2024-06-06 DIAGNOSIS — D508 Other iron deficiency anemias: Secondary | ICD-10-CM

## 2024-06-06 DIAGNOSIS — D649 Anemia, unspecified: Secondary | ICD-10-CM

## 2024-06-06 NOTE — Patient Instructions (Addendum)
 Your procedure is scheduled on: 06/13/24 - Friday Report to the Registration Desk on the 1st floor of the [facility]. To find out your arrival time, please call [phone] between 1PM - 3PM on: 06/11/24 - Thursday If your arrival time is 6:00 am, do not arrive before that time as the [facility] entrance doors do not open until 6:00 am.  REMEMBER: Instructions that are not followed completely may result in serious medical risk, up to and including death; or upon the discretion of your surgeon and anesthesiologist your surgery may need to be rescheduled.  Do not eat food or drink any liquids after midnight the night before surgery.  No gum chewing or hard candies.  One week prior to surgery: Stop Anti-inflammatories (NSAIDS) such as Advil , Aleve, Ibuprofen , Motrin , Naproxen, Naprosyn and Aspirin  based products such as Excedrin, Goody's Powder, BC Powder. You may take Tylenol  if needed for pain up until the day of surgery.  Stop ANY OVER THE COUNTER supplements until after surgery.   ON THE DAY OF SURGERY ONLY TAKE THESE MEDICATIONS WITH SIPS OF WATER:  none   No Alcohol for 24 hours before or after surgery.  No Smoking including e-cigarettes for 24 hours before surgery.  No chewable tobacco products for at least 6 hours before surgery.  No nicotine patches on the day of surgery.  Do not use any recreational drugs for at least a week (preferably 2 weeks) before your surgery.  Please be advised that the combination of cocaine and anesthesia may have negative outcomes, up to and including death. If you test positive for cocaine, your surgery will be cancelled.  On the morning of surgery brush your teeth with toothpaste and water, you may rinse your mouth with mouthwash if you wish. Do not swallow any toothpaste or mouthwash.  Use CHG Soap or wipes as directed on instruction sheet.  Do not wear jewelry, make-up, hairpins, clips or nail polish.  For welded (permanent)  jewelry: bracelets, anklets, waist bands, etc.  Please have this removed prior to surgery.  If it is not removed, there is a chance that hospital personnel will need to cut it off on the day of surgery.  Do not wear lotions, powders, or perfumes.   Do not shave body hair from the neck down 48 hours before surgery.  Contact lenses, hearing aids and dentures may not be worn into surgery.  Do not bring valuables to the hospital. [facility] is not responsible for any missing/lost belongings or valuables.   Notify your doctor if there is any change in your medical condition (cold, fever, infection).  Wear comfortable clothing (specific to your surgery type) to the hospital.  After surgery, you can help prevent lung complications by doing breathing exercises.  Take deep breaths and cough every 1-2 hours. Your doctor may order a device called an Incentive Spirometer to help you take deep breaths.  When coughing or sneezing, hold a pillow firmly against your incision with both hands. This is called "splinting." Doing this helps protect your incision. It also decreases belly discomfort.  If you are being admitted to the hospital overnight, leave your suitcase in the car. After surgery it may be brought to your room.  In case of increased patient census, it may be necessary for you, the patient, to continue your postoperative care in the Same Day Surgery department.  If you are being discharged the day of surgery, you will not be allowed to drive home. You will need  a responsible individual to drive you home and stay with you for 24 hours after surgery.   If you are taking public transportation, you will need to have a responsible individual with you.  Please call the Pre-admissions Testing Dept. at [phone] if you have any questions about these instructions.  Surgery Visitation Policy:  Patients having surgery or a procedure may have two visitors.  Children under the age of 89 must  have an adult with them who is not the patient.  Inpatient Visitation:    Visiting hours are 7 a.m. to 8 p.m. Up to four visitors are allowed at one time in a patient room. The visitors may rotate out with other people during the day.  One visitor age 39 or older may stay with the patient overnight and must be in the room by 8 p.m.   Merchandiser, retail to address health-related social needs:  [email or web address]                                                                                                             Preparing for Surgery with CHLORHEXIDINE GLUCONATE (CHG) Soap  Chlorhexidine Gluconate (CHG) Soap  o An antiseptic cleaner that kills germs and bonds with the skin to continue killing germs even after washing  o Used for showering the night before surgery and morning of surgery  Before surgery, you can play an important role by reducing the number of germs on your skin.  CHG (Chlorhexidine gluconate) soap is an antiseptic cleanser which kills germs and bonds with the skin to continue killing germs even after washing.  Please do not use if you have an allergy to CHG or antibacterial soaps. If your skin becomes reddened/irritated stop using the CHG.  1. Shower the NIGHT BEFORE SURGERY and the MORNING OF SURGERY with CHG soap.  2. If you choose to wash your hair, wash your hair first as usual with your normal shampoo.  3. After shampooing, rinse your hair and body thoroughly to remove the shampoo.  4. Use CHG as you would any other liquid soap. You can apply CHG directly to the skin and wash gently with a scrungie or a clean washcloth.  5. Apply the CHG soap to your body only from the neck down. Do not use on open wounds or open sores. Avoid contact with your eyes, ears, mouth, and genitals (private parts). Wash face and genitals (private parts) with your normal soap.  6. Wash thoroughly, paying special attention to the area where your surgery  will be performed.  7. Thoroughly rinse your body with warm water.  8. Do not shower/wash with your normal soap after using and rinsing off the CHG soap.  9. Pat yourself dry with a clean towel.  10. Wear clean pajamas to bed the night before surgery.  12. Place clean sheets on your bed the night of your first shower and do not sleep with pets.  13. Shower again with the CHG soap on the day of surgery prior to arriving at the  hospital.  14. Do not apply any deodorants/lotions/powders.  15. Please wear clean clothes to the hospital.

## 2024-06-10 ENCOUNTER — Encounter: Payer: Self-pay | Admitting: Urgent Care

## 2024-06-10 ENCOUNTER — Encounter
Admission: RE | Admit: 2024-06-10 | Discharge: 2024-06-10 | Disposition: A | Discharge status: Home / Self Care | Source: Ambulatory Visit | Attending: Obstetrics and Gynecology | Admitting: Obstetrics and Gynecology

## 2024-06-10 DIAGNOSIS — Z01818 Encounter for other preprocedural examination: Secondary | ICD-10-CM

## 2024-06-10 DIAGNOSIS — D508 Other iron deficiency anemias: Secondary | ICD-10-CM | POA: Insufficient documentation

## 2024-06-10 DIAGNOSIS — Z01812 Encounter for preprocedural laboratory examination: Secondary | ICD-10-CM | POA: Insufficient documentation

## 2024-06-10 LAB — CBC
HCT: 41.3 % (ref 36.0–46.0)
Hemoglobin: 13.3 g/dL (ref 12.0–15.0)
MCH: 28.7 pg (ref 26.0–34.0)
MCHC: 32.2 g/dL (ref 30.0–36.0)
MCV: 89 fL (ref 80.0–100.0)
Platelets: 227 K/uL (ref 150–400)
RBC: 4.64 MIL/uL (ref 3.87–5.11)
RDW: 13.3 % (ref 11.5–15.5)
WBC: 9.3 K/uL (ref 4.0–10.5)
nRBC: 0 % (ref 0.0–0.2)

## 2024-06-10 LAB — TYPE AND SCREEN
ABO/RH(D): A POS
Antibody Screen: NEGATIVE
Extend sample reason: UNDETERMINED

## 2024-06-13 ENCOUNTER — Encounter: Admission: RE | Payer: Self-pay | Source: Home / Self Care

## 2024-06-13 ENCOUNTER — Ambulatory Visit: Admission: RE | Admit: 2024-06-13 | Source: Home / Self Care | Admitting: Obstetrics and Gynecology

## 2024-06-13 SURGERY — OCCLUSION, FALLOPIAN TUBES, LAPAROSCOPIC, USING DEVICE
Anesthesia: Choice

## 2024-06-19 ENCOUNTER — Other Ambulatory Visit: Payer: Self-pay

## 2024-06-19 DIAGNOSIS — Z3009 Encounter for other general counseling and advice on contraception: Secondary | ICD-10-CM

## 2024-06-20 ENCOUNTER — Telehealth: Payer: Self-pay | Admitting: Obstetrics & Gynecology

## 2024-06-20 NOTE — Telephone Encounter (Signed)
 Spoke with patient on 06/12/2024 to inform that her surgery scheduled on 06/13/2024 has been cancelled due to provider no longer with practice. Patient is interested in rescheduling and has since been rescheduled for surgery consult with [facility]. Patient preference for a morning appt after 9am. Patient scheduled on 07/09/2024 at 9:55am. Upon speaking with patient, Patient aware that appointment information would be sent via mychart. Patient voiced understanding.

## 2024-06-26 ENCOUNTER — Encounter: Admitting: Obstetrics and Gynecology

## 2024-07-09 ENCOUNTER — Encounter: Payer: Self-pay | Admitting: Obstetrics and Gynecology

## 2024-07-09 ENCOUNTER — Ambulatory Visit: Admitting: Obstetrics and Gynecology

## 2024-07-09 VITALS — BP 110/76 | HR 55 | Ht 69.0 in | Wt 231.0 lb

## 2024-07-09 DIAGNOSIS — Z3009 Encounter for other general counseling and advice on contraception: Secondary | ICD-10-CM | POA: Diagnosis not present

## 2024-07-09 NOTE — Progress Notes (Signed)
  CC: tubal ligation counseling Subjective:    Patient ID: Janet Roach, female    DOB: Oct 11, 1996, 28 y.o.   MRN: [id number]  HPI 28 yo G3P3 seen for discussion of permanent sterilization.  She has SVD x 3.   Patient desires permanent sterilization.  Other reversible forms of contraception (over the counter/barrier methods; hormonal contraceptives including pill, patch, ring, Depo-Provera injection, Nexplanon implant; hormonal IUDs Skyla  and Mirena ; nonhormonal copper IUD Paragard) were discussed with patient; she declined all these modalities. Also discussed the option of vasectomy for her female partner; she also declined this option. She was given the choice between laparoscopic bilateral tubal sterilization using Filshie clips or laparoscopic bilateral salpingectomy. For the bilateral salpingectomy, she was told that both tubes will be resected via three small incisions; the failure risk of less than 1%.  Any future pregnancies will have to be attempted via IVF or other fertility procedures.  Reiterated permanence and irreversibility of both procedures.  Also emphasized risk of regret which is noted more in patients less than the age of 41.  All questions were answered. She desires laparoscopic bilateral salpingectomy.  Other risks of the procedure were discussed with patient including but not limited to: bleeding, infection, injury to surrounding organs and need for additional procedures.  Also discussed possibility of post-tubal pain syndrome. Patient verbalized understanding of these risks and wants to proceed with this procedure.  She was told that she will be contacted by our surgical scheduler regarding the time and date of her surgery; routine preoperative instructions of having nothing to eat or drink after midnight on the day prior to surgery and also coming to the hospital 1.5 hours prior to her time of surgery were also emphasized.  She was told she may be called for a preoperative  appointment about a week prior to surgery and will be given further preoperative instructions at that visit. Printed patient education handouts about the procedure were given to the patient to review at home. Medicaid papers were signed today.  Referral sent to scheduling.  Pap is current.   Review of Systems     Objective:   Physical Exam Vitals:   07/09/24 1006  BP: 110/76  Pulse: (!) 55         Assessment & Plan:   1. Consultation for female sterilization (Primary)  - Ambulatory Referral For Surgery Scheduling  2. Unwanted fertility Referral sent to schedule laparoscopic bilateral salpingectomy. - Ambulatory Referral For Surgery Scheduling  I spent 15 minutes dedicated to the care of this patient including previsit review of records, face to face time with the patient discussing treatment options and post visit testing.   [name], MD Faculty Attending, Center for [facility]

## 2024-07-09 NOTE — Progress Notes (Signed)
 28 y.o. New GYN presents for Surgical Consult.  Pt wants BTL.  She had a baby in June 2025.  Last PAP  10/24/23 NILM

## 2024-08-07 ENCOUNTER — Telehealth: Payer: Self-pay | Admitting: Family Medicine

## 2024-08-22 ENCOUNTER — Telehealth: Payer: Self-pay

## 2024-08-22 NOTE — Telephone Encounter (Signed)
 I called patient to schedule surgery w/ Dr. Zina on 09/30/24. I left a detailed message requesting patient call me back to schedule.

## 2024-09-03 ENCOUNTER — Telehealth: Payer: Self-pay

## 2024-09-03 DIAGNOSIS — D509 Iron deficiency anemia, unspecified: Secondary | ICD-10-CM

## 2024-09-03 NOTE — Progress Notes (Signed)
 Complex Care Management Note Care Guide Note  09/03/2024 Name: Janet Roach MRN: [id number] DOB: 1995-11-21   Complex Care Management Outreach Attempts: An unsuccessful telephone outreach was attempted today to offer the patient information about available complex care management services.  Follow Up Plan:  Additional outreach attempts will be made to offer the patient complex care management information and services.   Encounter Outcome:  No Answer  Dreama [facility]  [facility], [facility] VBCI Assistant Direct Dial: [phone]  Fax: [phone]

## 2024-09-05 NOTE — Progress Notes (Signed)
 Complex Care Management Note  Care Guide Note 09/05/2024 Name: Janet Roach MRN: [id number] DOB: February 09, 1996  Janet Roach is a 28 y.o. year old female who sees Pcp, No for primary care. I reached out to Alfonso Cardinal by phone today to offer complex care management services.  [name] was given information about Complex Care Management services today including:   The Complex Care Management services include support from the care team which includes your Nurse Care Manager, Clinical Social Worker, or Pharmacist.  The Complex Care Management team is here to help remove barriers to the health concerns and goals most important to you. Complex Care Management services are voluntary, and the patient may decline or stop services at any time by request to their care team member.   Complex Care Management Consent Status: Patient did not agree to participate in complex care management services at this time.  Encounter Outcome:  Patient Refused  Dreama Agent [facility], [facility] VBCI Assistant Direct Dial: [phone]  Fax: [phone]

## 2024-10-30 ENCOUNTER — Encounter (HOSPITAL_COMMUNITY): Payer: Self-pay | Admitting: Obstetrics and Gynecology

## 2024-10-30 ENCOUNTER — Other Ambulatory Visit: Payer: Self-pay

## 2024-10-30 NOTE — Progress Notes (Signed)
 SDW call  Patient was given pre-op instructions over the phone. Patient verbalized understanding of instructions provided.  Denies any SOB, fever or cough   PCP - Denies Cardiologist -  Pulmonary:    PPM/ICD - denies Device Orders - na Rep Notified - na   Chest x-ray -  EKG -   Stress Test - ECHO -  Cardiac Cath -   Sleep Study/sleep apnea/CPAP: denies  Non-diabetic   Blood Thinner Instructions: denies Aspirin  Instructions:denies   ERAS Protcol - NPO   Anesthesia review: No  Your procedure is scheduled on Monday November 03, 2024  Report to [facility] Main Entrance A at 1330 PM., then check in with the Admitting office.  Call this number if you have problems the morning of surgery:  [phone]   If you have any questions prior to your surgery date call [phone]: Open Monday-Friday 8am-4pm If you experience any cold or flu symptoms such as cough, fever, chills, shortness of breath, etc. between now and your scheduled surgery, please notify us  at the above number     Remember:  Do not eat or drink after midnight the night before your surgery  Take these medicines the morning of surgery with A SIP OF WATER:  None  As of today, STOP taking any Aspirin  (unless otherwise instructed by your surgeon) Aleve, Naproxen, Ibuprofen , Motrin , Advil , Goody's, BC's, all herbal medications, fish oil, and all vitamins.

## 2024-10-31 ENCOUNTER — Ambulatory Visit: Payer: Self-pay | Admitting: Obstetrics and Gynecology

## 2024-10-31 VITALS — BP 129/77 | HR 59 | Ht 69.0 in | Wt 222.6 lb

## 2024-10-31 DIAGNOSIS — Z3009 Encounter for other general counseling and advice on contraception: Secondary | ICD-10-CM | POA: Insufficient documentation

## 2024-10-31 DIAGNOSIS — Z01818 Encounter for other preprocedural examination: Secondary | ICD-10-CM

## 2024-10-31 DIAGNOSIS — Z308 Encounter for other contraceptive management: Secondary | ICD-10-CM

## 2024-10-31 NOTE — Progress Notes (Signed)
 Pt presents for pre/op. Pt has no questions or concerns at this time.

## 2024-10-31 NOTE — Progress Notes (Signed)
 Patient called and updated with new arrival time of 1245

## 2024-10-31 NOTE — H&P (View-Only) (Signed)
 " OB/GYN Pre-Op History and Physical  Janet Roach is a 29 y.o. [phone] presenting for preop appointment for bilateral salpingectomy.  Patient desires permanent sterilization.  Other reversible forms of contraception (over the counter/barrier methods; hormonal contraceptives including pill, patch, ring, Depo-Provera injection, Nexplanon implant; hormonal IUDs Skyla  and Mirena ; nonhormonal copper IUD Paragard) were discussed with patient; she declined all these modalities. Also discussed the option of vasectomy for her female partner; she also declined this option.  For bilateral salpingectomy, she was told that both tubes will be resected via three small incisions; the failure risk of less than 1%.  Any future pregnancies will have to be attempted via IVF or other fertility procedures.  Reiterated permanence and irreversibility of both procedures Also emphasized risk of regret which is noted more in patients less than the age of 27.  All questions were answered. She desires laparoscopic bilateral salpingectomy.  Other risks of the procedure were discussed with patient including but not limited to: bleeding, infection, injury to surrounding organs and need for additional procedures.  Also discussed possibility of post-tubal pain syndrome. Patient verbalized understanding of these risks and wants to proceed with this procedure.  She was told that she will be contacted by our surgical scheduler regarding the time and date of her surgery; routine preoperative instructions of having nothing to eat or drink after midnight on the day prior to surgery and also coming to the hospital 1.5 hours prior to her time of surgery were also emphasized.  She was told she may be called for a preoperative appointment about a week prior to surgery and will be given further preoperative instructions at that visit. Printed patient education handouts about the procedure were given to the patient to review at home. Medicaid papers  were signed today.          Past Medical History:  Diagnosis Date   Anemia    states hx anemia during pregnancy #1   History of marijuana use 10/24/2023   Patient denies medical problems    Pica ice 2-3 c/day 06/27/2021   Supervision of other normal pregnancy, antepartum 10/24/2023              Nursing Staff    Provider      Office Location     [facility]    Dating     LMP      Language     English    Anatomy US      On 11/29/23 @ [redacted]w[redacted]d, agrees with LMP: post placenta, AFV wnl, EFW 63%, subop views of RVOT, limited nose/lips, otherwise normal, repeat 12/24/23- cont issue with lip/profile  01/29/24- anatomy complete, wnl      BP Cuff Size     Adult    Genetic Screen     NIPS: negative (1   Tubal ligation desired 02/14/2024    Past Surgical History:  Procedure Laterality Date   EYE SURGERY     states age 26 or 25   STRABISMUS SURGERY     WISDOM TOOTH EXTRACTION Bilateral 08/2022    OB History  Gravida Para Term Preterm AB Living  3 3 3  0 0 3  SAB IAB Ectopic Multiple Live Births  0 0 0 0 3    # Outcome Date GA Lbr Len/2nd Weight Sex Type Anes PTL Lv  3 Term 04/05/24 [redacted]w[redacted]d 16:50 / 00:05 7 lb 3.3 oz (3.27 kg) F Vag-Spont None  LIV  2 Term 08/24/21 [redacted]w[redacted]d / 01:00 8 lb 0.4 oz (3.64  kg) F Vag-Spont EPI  LIV  1 Term 01/11/18 [redacted]w[redacted]d / 00:35 7 lb 7.9 oz (3.4 kg) M Vag-Spont EPI  LIV    Social History   Socioeconomic History   Marital status: Single    Spouse name: Chyrl   Number of children: 3   Years of education: 13   Highest education level: Associate degree: occupational, scientist, product/process development, or vocational program  Occupational History   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   Occupation: Futures Trader  Tobacco Use   Smoking status: Never    Passive exposure: Never   Smokeless tobacco: Never   Tobacco comments:    denies seconehand smoke   Vaping Use   Vaping status: Never Used  Substance and Sexual Activity   Alcohol use: Not Currently    Comment: I don't  drink   Drug use: Not Currently    Types: Marijuana    Comment: last use 01/2023   Sexual activity: Not Currently    Partners: Male    Birth control/protection: None    Comment: became preg while taking Micronor / stopped ocp 09/24/23  Other Topics Concern   Not on file  Social History Narrative   Lives with her partner and their 3 kids.   Social Drivers of Health   Tobacco Use: Low Risk (10/30/2024)   Patient History    Smoking Tobacco Use: Never    Smokeless Tobacco Use: Never    Passive Exposure: Never  Financial Resource Strain: Low Risk (10/24/2023)   Overall Financial Resource Strain (CARDIA)    Difficulty of Paying Living Expenses: Not hard at all  Food Insecurity: No Food Insecurity (04/05/2024)   Epic    Worried About Programme Researcher, Broadcasting/film/video in the Last Year: Never true    Ran Out of Food in the Last Year: Never true  Transportation Needs: No Transportation Needs (04/05/2024)   Epic    Lack of Transportation (Medical): No    Lack of Transportation (Non-Medical): No  Physical Activity: Not on file  Stress: Not on file  Social Connections: Not on file  Depression (PHQ2-9): Low Risk (07/09/2024)   Depression (PHQ2-9)    PHQ-2 Score: 0  Alcohol Screen: Not on file  Housing: Low Risk (04/05/2024)   Epic    Unable to Pay for Housing in the Last Year: No    Number of Times Moved in the Last Year: 0    Homeless in the Last Year: No  Utilities: Not At Risk (04/05/2024)   Epic    Threatened with loss of utilities: No  Health Literacy: Not on file    Family History  Problem Relation Age of Onset   Hypertension Mother    Hypertension Father    Thyroid  disease Maternal Aunt    Multiple births Maternal Aunt    Bone cancer Maternal Uncle    Cancer Paternal Aunt    Breast cancer Paternal Aunt    Cancer Paternal Uncle    Prostate cancer Paternal Uncle    Diabetes Maternal Grandmother    Hypertension Maternal Grandmother    Dementia Maternal Grandmother    Heart disease  Maternal Grandmother        states heart attack and had surgery   Miscarriages / Stillbirths Maternal Grandmother        had a stillbirth per pt   Multiple births Maternal Grandmother    Cancer Paternal Grandmother        breast   Cancer Paternal Grandfather     (  Not in a hospital admission)   Allergies[1]  Review of Systems: Negative except for what is mentioned in HPI.     Physical Exam: BP 129/77   Pulse (!) 59   Ht 5' 9 (1.753 m)   Wt 222 lb 9.6 oz (101 kg)   LMP  (LMP Unknown)   BMI 32.87 kg/m  CONSTITUTIONAL: obese, Well-developed, well-nourished female in no acute distress.  HENT:  Normocephalic, atraumatic, External right and left ear normal. Oropharynx is clear and moist EYES: Conjunctivae and EOM are normal.  NECK: Normal range of motion, supple, no masses SKIN: Skin is warm and dry. No rash noted. Not diaphoretic. No erythema. No pallor. NEUROLGIC: Alert and oriented to person, place, and time. Normal reflexes, muscle tone coordination. No cranial nerve deficit noted. PSYCHIATRIC: Normal mood and affect. Normal behavior. Normal judgment and thought content. CARDIOVASCULAR: Normal heart rate noted, regular rhythm RESPIRATORY: Effort and breath sounds normal, no problems with respiration noted ABDOMEN: Soft, nontender, nondistended, PELVIC: Deferred MUSCULOSKELETAL: Normal range of motion. No edema and no tenderness. 2+ distal pulses.   Pertinent Labs/Studies:   No results found for this or any previous visit (from the past 72 hours).     Assessment and Plan :Janet Roach is a 29 y.o. G3P3003 here for preop appointment.   Plan for laparoscopic bilateral salpingectomy NPO Admission labs ordered VS Q4 Risks and benefits discussed in detail.  Will proceed.   Jerilynn Buddle, M.D. Attending Obstetrician & Gynecologist, Faculty Practice Center for [employer], [facility]     [1] No Known Allergies  "

## 2024-10-31 NOTE — Progress Notes (Signed)
 " OB/GYN Pre-Op History and Physical  Janet Roach is a 29 y.o. [phone] presenting for preop appointment for bilateral salpingectomy.  Patient desires permanent sterilization.  Other reversible forms of contraception (over the counter/barrier methods; hormonal contraceptives including pill, patch, ring, Depo-Provera injection, Nexplanon implant; hormonal IUDs Skyla  and Mirena ; nonhormonal copper IUD Paragard) were discussed with patient; she declined all these modalities. Also discussed the option of vasectomy for her female partner; she also declined this option.  For bilateral salpingectomy, she was told that both tubes will be resected via three small incisions; the failure risk of less than 1%.  Any future pregnancies will have to be attempted via IVF or other fertility procedures.  Reiterated permanence and irreversibility of both procedures Also emphasized risk of regret which is noted more in patients less than the age of 27.  All questions were answered. She desires laparoscopic bilateral salpingectomy.  Other risks of the procedure were discussed with patient including but not limited to: bleeding, infection, injury to surrounding organs and need for additional procedures.  Also discussed possibility of post-tubal pain syndrome. Patient verbalized understanding of these risks and wants to proceed with this procedure.  She was told that she will be contacted by our surgical scheduler regarding the time and date of her surgery; routine preoperative instructions of having nothing to eat or drink after midnight on the day prior to surgery and also coming to the hospital 1.5 hours prior to her time of surgery were also emphasized.  She was told she may be called for a preoperative appointment about a week prior to surgery and will be given further preoperative instructions at that visit. Printed patient education handouts about the procedure were given to the patient to review at home. Medicaid papers  were signed today.          Past Medical History:  Diagnosis Date   Anemia    states hx anemia during pregnancy #1   History of marijuana use 10/24/2023   Patient denies medical problems    Pica ice 2-3 c/day 06/27/2021   Supervision of other normal pregnancy, antepartum 10/24/2023              Nursing Staff    Provider      Office Location     [facility]    Dating     LMP      Language     English    Anatomy US      On 11/29/23 @ [redacted]w[redacted]d, agrees with LMP: post placenta, AFV wnl, EFW 63%, subop views of RVOT, limited nose/lips, otherwise normal, repeat 12/24/23- cont issue with lip/profile  01/29/24- anatomy complete, wnl      BP Cuff Size     Adult    Genetic Screen     NIPS: negative (1   Tubal ligation desired 02/14/2024    Past Surgical History:  Procedure Laterality Date   EYE SURGERY     states age 26 or 25   STRABISMUS SURGERY     WISDOM TOOTH EXTRACTION Bilateral 08/2022    OB History  Gravida Para Term Preterm AB Living  3 3 3  0 0 3  SAB IAB Ectopic Multiple Live Births  0 0 0 0 3    # Outcome Date GA Lbr Len/2nd Weight Sex Type Anes PTL Lv  3 Term 04/05/24 [redacted]w[redacted]d 16:50 / 00:05 7 lb 3.3 oz (3.27 kg) F Vag-Spont None  LIV  2 Term 08/24/21 [redacted]w[redacted]d / 01:00 8 lb 0.4 oz (3.64  kg) F Vag-Spont EPI  LIV  1 Term 01/11/18 [redacted]w[redacted]d / 00:35 7 lb 7.9 oz (3.4 kg) M Vag-Spont EPI  LIV    Social History   Socioeconomic History   Marital status: Single    Spouse name: Chyrl   Number of children: 3   Years of education: 13   Highest education level: Associate degree: occupational, scientist, product/process development, or vocational program  Occupational History   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   Occupation: Futures Trader  Tobacco Use   Smoking status: Never    Passive exposure: Never   Smokeless tobacco: Never   Tobacco comments:    denies seconehand smoke   Vaping Use   Vaping status: Never Used  Substance and Sexual Activity   Alcohol use: Not Currently    Comment: I don't  drink   Drug use: Not Currently    Types: Marijuana    Comment: last use 01/2023   Sexual activity: Not Currently    Partners: Male    Birth control/protection: None    Comment: became preg while taking Micronor / stopped ocp 09/24/23  Other Topics Concern   Not on file  Social History Narrative   Lives with her partner and their 3 kids.   Social Drivers of Health   Tobacco Use: Low Risk (10/30/2024)   Patient History    Smoking Tobacco Use: Never    Smokeless Tobacco Use: Never    Passive Exposure: Never  Financial Resource Strain: Low Risk (10/24/2023)   Overall Financial Resource Strain (CARDIA)    Difficulty of Paying Living Expenses: Not hard at all  Food Insecurity: No Food Insecurity (04/05/2024)   Epic    Worried About Programme Researcher, Broadcasting/film/video in the Last Year: Never true    Ran Out of Food in the Last Year: Never true  Transportation Needs: No Transportation Needs (04/05/2024)   Epic    Lack of Transportation (Medical): No    Lack of Transportation (Non-Medical): No  Physical Activity: Not on file  Stress: Not on file  Social Connections: Not on file  Depression (PHQ2-9): Low Risk (07/09/2024)   Depression (PHQ2-9)    PHQ-2 Score: 0  Alcohol Screen: Not on file  Housing: Low Risk (04/05/2024)   Epic    Unable to Pay for Housing in the Last Year: No    Number of Times Moved in the Last Year: 0    Homeless in the Last Year: No  Utilities: Not At Risk (04/05/2024)   Epic    Threatened with loss of utilities: No  Health Literacy: Not on file    Family History  Problem Relation Age of Onset   Hypertension Mother    Hypertension Father    Thyroid  disease Maternal Aunt    Multiple births Maternal Aunt    Bone cancer Maternal Uncle    Cancer Paternal Aunt    Breast cancer Paternal Aunt    Cancer Paternal Uncle    Prostate cancer Paternal Uncle    Diabetes Maternal Grandmother    Hypertension Maternal Grandmother    Dementia Maternal Grandmother    Heart disease  Maternal Grandmother        states heart attack and had surgery   Miscarriages / Stillbirths Maternal Grandmother        had a stillbirth per pt   Multiple births Maternal Grandmother    Cancer Paternal Grandmother        breast   Cancer Paternal Grandfather     (  Not in a hospital admission)   Allergies[1]  Review of Systems: Negative except for what is mentioned in HPI.     Physical Exam: BP 129/77   Pulse (!) 59   Ht 5' 9 (1.753 m)   Wt 222 lb 9.6 oz (101 kg)   LMP  (LMP Unknown)   BMI 32.87 kg/m  CONSTITUTIONAL: obese, Well-developed, well-nourished female in no acute distress.  HENT:  Normocephalic, atraumatic, External right and left ear normal. Oropharynx is clear and moist EYES: Conjunctivae and EOM are normal.  NECK: Normal range of motion, supple, no masses SKIN: Skin is warm and dry. No rash noted. Not diaphoretic. No erythema. No pallor. NEUROLGIC: Alert and oriented to person, place, and time. Normal reflexes, muscle tone coordination. No cranial nerve deficit noted. PSYCHIATRIC: Normal mood and affect. Normal behavior. Normal judgment and thought content. CARDIOVASCULAR: Normal heart rate noted, regular rhythm RESPIRATORY: Effort and breath sounds normal, no problems with respiration noted ABDOMEN: Soft, nontender, nondistended, PELVIC: Deferred MUSCULOSKELETAL: Normal range of motion. No edema and no tenderness. 2+ distal pulses.   Pertinent Labs/Studies:   No results found for this or any previous visit (from the past 72 hours).     Assessment and Plan :Patsie Mccardle is a 29 y.o. G3P3003 here for preop appointment.   Plan for laparoscopic bilateral salpingectomy NPO Admission labs ordered VS Q4 Risks and benefits discussed in detail.  Will proceed.   Jerilynn Buddle, M.D. Attending Obstetrician & Gynecologist, Faculty Practice Center for [employer], [facility]     [1] No Known Allergies  "

## 2024-11-03 ENCOUNTER — Other Ambulatory Visit: Payer: Self-pay

## 2024-11-03 ENCOUNTER — Ambulatory Visit (HOSPITAL_COMMUNITY)
Admission: RE | Admit: 2024-11-03 | Discharge: 2024-11-03 | Disposition: A | Discharge status: Home / Self Care | Attending: Obstetrics and Gynecology | Admitting: Obstetrics and Gynecology

## 2024-11-03 ENCOUNTER — Encounter (HOSPITAL_COMMUNITY): Payer: Self-pay | Admitting: Obstetrics and Gynecology

## 2024-11-03 ENCOUNTER — Ambulatory Visit (HOSPITAL_COMMUNITY): Admitting: Anesthesiology

## 2024-11-03 ENCOUNTER — Encounter (HOSPITAL_COMMUNITY)
Admission: RE | Disposition: A | Discharge status: Home / Self Care | Payer: Self-pay | Source: Home / Self Care | Attending: Obstetrics and Gynecology

## 2024-11-03 DIAGNOSIS — Z308 Encounter for other contraceptive management: Secondary | ICD-10-CM

## 2024-11-03 DIAGNOSIS — Z302 Encounter for sterilization: Secondary | ICD-10-CM

## 2024-11-03 HISTORY — PX: LAPAROSCOPIC BILATERAL SALPINGECTOMY: SHX5889

## 2024-11-03 LAB — CBC
HCT: 42.5 % (ref 36.0–46.0)
Hemoglobin: 14.4 g/dL (ref 12.0–15.0)
MCH: 29.8 pg (ref 26.0–34.0)
MCHC: 33.9 g/dL (ref 30.0–36.0)
MCV: 87.8 fL (ref 80.0–100.0)
Platelets: 248 K/uL (ref 150–400)
RBC: 4.84 MIL/uL (ref 3.87–5.11)
RDW: 12.4 % (ref 11.5–15.5)
WBC: 8.5 K/uL (ref 4.0–10.5)
nRBC: 0 % (ref 0.0–0.2)

## 2024-11-03 LAB — TYPE AND SCREEN
ABO/RH(D): A POS
Antibody Screen: NEGATIVE

## 2024-11-03 LAB — POCT PREGNANCY, URINE: Preg Test, Ur: NEGATIVE

## 2024-11-03 MED ORDER — LACTATED RINGERS IV SOLN: INTRAVENOUS | Status: DC

## 2024-11-03 MED ORDER — LIDOCAINE 2% (20 MG/ML) 5 ML SYRINGE
INTRAMUSCULAR | Status: AC
  Filled 2024-11-03: qty 5

## 2024-11-03 MED ORDER — SUGAMMADEX SODIUM 200 MG/2ML IV SOLN
INTRAVENOUS | Status: DC | PRN
  Administered 2024-11-03: 400 mg via INTRAVENOUS

## 2024-11-03 MED ORDER — CHLORHEXIDINE GLUCONATE 0.12 % MT SOLN: 15.0000 mL | Freq: Once | OROMUCOSAL | Status: AC

## 2024-11-03 MED ORDER — ROCURONIUM BROMIDE 10 MG/ML (PF) SYRINGE
PREFILLED_SYRINGE | INTRAVENOUS | Status: AC
  Filled 2024-11-03: qty 10

## 2024-11-03 MED ORDER — CEFAZOLIN SODIUM-DEXTROSE 2-4 GM/100ML-% IV SOLN
INTRAVENOUS | Status: AC
  Filled 2024-11-03: qty 100

## 2024-11-03 MED ORDER — DEXAMETHASONE SOD PHOSPHATE PF 10 MG/ML IJ SOLN
INTRAMUSCULAR | Status: AC
  Filled 2024-11-03: qty 1

## 2024-11-03 MED ORDER — FENTANYL CITRATE (PF) 100 MCG/2ML IJ SOLN: 25.0000 ug | INTRAMUSCULAR | Status: DC | PRN

## 2024-11-03 MED ORDER — IBUPROFEN 600 MG PO TABS: 600.0000 mg | ORAL_TABLET | Freq: Four times a day (QID) | ORAL | 2 refills | Status: DC | PRN

## 2024-11-03 MED ORDER — HYDROMORPHONE HCL 1 MG/ML IJ SOLN
INTRAMUSCULAR | Status: AC
  Filled 2024-11-03: qty 0.5

## 2024-11-03 MED ORDER — SOD CITRATE-CITRIC ACID 500-334 MG/5ML PO SOLN
ORAL | Status: AC
  Administered 2024-11-03: 30 mL via ORAL
  Filled 2024-11-03: qty 30

## 2024-11-03 MED ORDER — DEXMEDETOMIDINE HCL IN NACL 80 MCG/20ML IV SOLN
INTRAVENOUS | Status: DC | PRN
  Administered 2024-11-03 (×2): 8 ug via INTRAVENOUS
  Administered 2024-11-03: 4 ug via INTRAVENOUS

## 2024-11-03 MED ORDER — ACETAMINOPHEN 500 MG PO TABS: 1000.0000 mg | ORAL_TABLET | ORAL | Status: AC

## 2024-11-03 MED ORDER — BUPIVACAINE HCL 0.25 % IJ SOLN
INTRAMUSCULAR | Status: DC | PRN
  Administered 2024-11-03: 8 mL

## 2024-11-03 MED ORDER — CEFAZOLIN SODIUM-DEXTROSE 2-4 GM/100ML-% IV SOLN
2.0000 g | INTRAVENOUS | Status: AC
  Administered 2024-11-03: 2 g via INTRAVENOUS

## 2024-11-03 MED ORDER — ONDANSETRON HCL 4 MG/2ML IJ SOLN
INTRAMUSCULAR | Status: AC
  Filled 2024-11-03: qty 2

## 2024-11-03 MED ORDER — MIDAZOLAM HCL 2 MG/2ML IJ SOLN
INTRAMUSCULAR | Status: AC
  Filled 2024-11-03: qty 2

## 2024-11-03 MED ORDER — KETOROLAC TROMETHAMINE 30 MG/ML IJ SOLN
INTRAMUSCULAR | Status: DC | PRN
  Administered 2024-11-03: 30 mg via INTRAVENOUS

## 2024-11-03 MED ORDER — DEXAMETHASONE SOD PHOSPHATE PF 10 MG/ML IJ SOLN
INTRAMUSCULAR | Status: DC | PRN
  Administered 2024-11-03: 8 mg via INTRAVENOUS

## 2024-11-03 MED ORDER — OXYCODONE HCL 5 MG/5ML PO SOLN: 5.0000 mg | Freq: Once | ORAL | Status: DC | PRN

## 2024-11-03 MED ORDER — MIDAZOLAM HCL 5 MG/5ML IJ SOLN
INTRAMUSCULAR | Status: DC | PRN
  Administered 2024-11-03: 2 mg via INTRAVENOUS

## 2024-11-03 MED ORDER — ONDANSETRON HCL 4 MG/2ML IJ SOLN
INTRAMUSCULAR | Status: DC | PRN
  Administered 2024-11-03: 4 mg via INTRAVENOUS

## 2024-11-03 MED ORDER — SOD CITRATE-CITRIC ACID 500-334 MG/5ML PO SOLN: 30.0000 mL | ORAL | Status: AC

## 2024-11-03 MED ORDER — KETOROLAC TROMETHAMINE 30 MG/ML IJ SOLN
INTRAMUSCULAR | Status: AC
  Filled 2024-11-03: qty 1

## 2024-11-03 MED ORDER — OXYCODONE HCL 5 MG PO TABS: 5.0000 mg | ORAL_TABLET | Freq: Once | ORAL | Status: DC | PRN

## 2024-11-03 MED ORDER — CHLORHEXIDINE GLUCONATE 0.12 % MT SOLN
OROMUCOSAL | Status: AC
  Administered 2024-11-03: 15 mL via OROMUCOSAL
  Filled 2024-11-03: qty 15

## 2024-11-03 MED ORDER — PROPOFOL 10 MG/ML IV BOLUS
INTRAVENOUS | Status: DC | PRN
  Administered 2024-11-03: 140 ug via INTRAVENOUS

## 2024-11-03 MED ORDER — LIDOCAINE 2% (20 MG/ML) 5 ML SYRINGE
INTRAMUSCULAR | Status: DC | PRN
  Administered 2024-11-03: 60 mg via INTRAVENOUS

## 2024-11-03 MED ORDER — ACETAMINOPHEN 500 MG PO TABS
ORAL_TABLET | ORAL | Status: AC
  Administered 2024-11-03: 1000 mg via ORAL
  Filled 2024-11-03: qty 2

## 2024-11-03 MED ORDER — HYDROMORPHONE HCL 1 MG/ML IJ SOLN
INTRAMUSCULAR | Status: DC | PRN
  Administered 2024-11-03: .5 mg via INTRAVENOUS

## 2024-11-03 MED ORDER — FENTANYL CITRATE (PF) 100 MCG/2ML IJ SOLN
INTRAMUSCULAR | Status: AC
  Filled 2024-11-03: qty 2

## 2024-11-03 MED ORDER — POVIDONE-IODINE 10 % EX SWAB
2.0000 | Freq: Once | CUTANEOUS | Status: AC
  Administered 2024-11-03: 2 via TOPICAL

## 2024-11-03 MED ORDER — ROCURONIUM 10MG/ML (10ML) SYRINGE FOR MEDFUSION PUMP - OPTIME
INTRAVENOUS | Status: DC | PRN
  Administered 2024-11-03: 60 mg via INTRAVENOUS

## 2024-11-03 MED ORDER — ONDANSETRON HCL 4 MG/2ML IJ SOLN: 4.0000 mg | Freq: Once | INTRAMUSCULAR | Status: DC | PRN

## 2024-11-03 MED ORDER — OXYCODONE HCL 5 MG PO TABS: 5.0000 mg | ORAL_TABLET | ORAL | 0 refills | Status: DC | PRN

## 2024-11-03 MED ORDER — ORAL CARE MOUTH RINSE: 15.0000 mL | Freq: Once | OROMUCOSAL | Status: AC

## 2024-11-03 MED ORDER — ACETAMINOPHEN 10 MG/ML IV SOLN: 1000.0000 mg | Freq: Once | INTRAVENOUS | Status: DC | PRN

## 2024-11-03 MED ORDER — FENTANYL CITRATE (PF) 100 MCG/2ML IJ SOLN
INTRAMUSCULAR | Status: DC | PRN
  Administered 2024-11-03: 100 ug via INTRAVENOUS

## 2024-11-03 NOTE — Op Note (Signed)
 [facility] PROCEDURE DATE: 11/03/2024   PREOPERATIVE DIAGNOSIS:  Undesired fertility  POSTOPERATIVE DIAGNOSIS:  Undesired fertility  PROCEDURE:  Laparoscopic Bilateral Salpingectomy   SURGEON:  Dr. Jerilynn Buddle, Md  ASSISTANT:  Dr. Vina Solian. An experienced assistant was required given the standard of surgical care given the complexity of the case.  This assistant was needed for exposure, dissection, suctioning, retraction, instrument exchange, and for overall help during the procedure.  ANESTHESIA:  General endotracheal  COMPLICATIONS:  None immediate.  ESTIMATED BLOOD LOSS:  15 ml.  FLUIDS: 1000 ml LR.  URINE OUTPUT:  100 ml of clear urine.  INDICATIONS: 29 y.o. [id number] with undesired fertility, desires permanent sterilization. Other reversible forms of contraception were discussed with patient; she declines all other modalities.  Risks of procedure discussed with patient including permanence of method, risk of regret, bleeding, infection, injury to surrounding organs and need for additional procedures including laparotomy.  Failure risk less than 0.5% with increased risk of ectopic gestation if pregnancy occurs was also discussed with patient.  Written informed consent was obtained.    FINDINGS:  Normal uterus, fallopian tubes, and ovaries.  TECHNIQUE:  The patient was taken to the operating room where general anesthesia was obtained without difficulty.  She was then placed in the dorsal lithotomy position and prepared and draped in sterile fashion.    Attention was then turned to the patient's abdomen where a 6-mm skin incision was made in the umbilical fold.  The Optiview 5-mm trocar and sleeve were then advanced without difficulty with the laparoscope under direct visualization into the abdomen.  The abdomen was then insufflated with carbon dioxide gas.  Adequate pneumoperitoneum was obtained.  A survey of the patient's pelvis and abdomen revealed the findings above. A right  5-mm lower quadrant port was placed along with a 7/8 left lateral port under direct visualization.  The fallopian tubes were transected from the uterine attachments and the underlying mesosalpinx with the LigaSure device allowing for bilateral salpingectomy.  The fallopian tubes were then removed from the abdomen under direct visualization.  The operative site was surveyed, and it was found to be hemostatic.   No intraoperative injury to other surrounding organs was noted.  The abdomen was desufflated and all instruments were then removed from the patient's abdomen.  All skin incisions were closed with 4-0 Vicryl and Dermabond.  The uterine manipulator was removed from the cervix without complications. The patient tolerated the procedure well.  Sponge, lap, and needle counts were correct times two.  The patient was then taken to the recovery room awake, extubated and in stable condition.  The patient will be discharged to home as per PACU criteria.  Routine postoperative instructions given.  She was prescribed oxycodone  and Ibuprofen .  She will follow up in the clinic in  2 weeks for postoperative evaluation.   Jerilynn Buddle, MD, FACOG Attending Obstetrician & Gynecologist Faculty Practice, [facility] [place]

## 2024-11-03 NOTE — Transfer of Care (Signed)
 Immediate Anesthesia Transfer of Care Note  Patient: Janet Roach  Procedure(s) Performed: SALPINGECTOMY, BILATERAL, LAPAROSCOPIC (Bilateral)  Patient Location: PACU  Anesthesia Type:General  Level of Consciousness: awake, alert , and patient cooperative  Airway & Oxygen Therapy: Patient Spontanous Breathing and Patient connected to face mask oxygen  Post-op Assessment: Report given to RN and Post -op Vital signs reviewed and stable  Post vital signs: Reviewed and stable  Last Vitals:  Vitals Value Taken Time  BP 143/88 11/03/24 16:48  Temp    Pulse 83 11/03/24 16:52  Resp 16 11/03/24 16:52  SpO2 100 % 11/03/24 16:52  Vitals shown include unfiled device data.  Last Pain:  Vitals:   11/03/24 1238  TempSrc:   PainSc: 0-No pain         Complications: No notable events documented.

## 2024-11-03 NOTE — Anesthesia Preprocedure Evaluation (Addendum)
"                                    Anesthesia Evaluation  Patient identified by MRN, date of birth, ID band Patient awake    Reviewed: Allergy & Precautions, NPO status , Patient's Chart, lab work & pertinent test results, reviewed documented beta blocker date and time   History of Anesthesia Complications Negative for: history of anesthetic complications  Airway Mallampati: II  TM Distance: >3 FB     Dental no notable dental hx.    Pulmonary neg COPD   breath sounds clear to auscultation       Cardiovascular (-) hypertension(-) CAD, (-) Past MI and (-) Cardiac Stents  Rhythm:Regular Rate:Normal     Neuro/Psych neg Seizures PSYCHIATRIC DISORDERS         GI/Hepatic ,,,(+) neg Cirrhosis        Endo/Other    Renal/GU Renal disease     Musculoskeletal   Abdominal   Peds  Hematology  (+) Blood dyscrasia, anemia   Anesthesia Other Findings   Reproductive/Obstetrics                              Anesthesia Physical Anesthesia Plan  ASA: 2  Anesthesia Plan: General   Post-op Pain Management:    Induction: Intravenous  PONV Risk Score and Plan: 3 and Ondansetron  and Dexamethasone   Airway Management Planned: Oral ETT  Additional Equipment:   Intra-op Plan:   Post-operative Plan: Extubation in OR  Informed Consent: I have reviewed the patients History and Physical, chart, labs and discussed the procedure including the risks, benefits and alternatives for the proposed anesthesia with the patient or authorized representative who has indicated his/her understanding and acceptance.     Dental advisory given  Plan Discussed with: CRNA  Anesthesia Plan Comments:          Anesthesia Quick Evaluation  "

## 2024-11-03 NOTE — Interval H&P Note (Signed)
 History and Physical Interval Note:  11/03/2024 3:03 PM  Janet Roach  has presented today for surgery, with the diagnosis of Undesired Fertility.  The various methods of treatment have been discussed with the patient and family. After consideration of risks, benefits and other options for treatment, the patient has consented to  Procedures: SALPINGECTOMY, BILATERAL, LAPAROSCOPIC (Bilateral) as a surgical intervention.  The patient's history has been reviewed, patient examined, no change in status, stable for surgery.  I have reviewed the patient's chart and labs.  Questions were answered to the patient's satisfaction.     [name]

## 2024-11-03 NOTE — Anesthesia Procedure Notes (Addendum)
 Procedure Name: Intubation Date/Time: 11/03/2024 3:59 PM  Performed by: Denton Niels CROME, CRNAPre-anesthesia Checklist: Patient identified, Emergency Drugs available, Suction available and Patient being monitored Patient Re-evaluated:Patient Re-evaluated prior to induction Oxygen Delivery Method: Circle system utilized Preoxygenation: Pre-oxygenation with 100% oxygen Induction Type: IV induction Ventilation: Mask ventilation without difficulty Laryngoscope Size: Mac and 3 Grade View: Grade I Tube type: Oral Number of attempts: 1 Airway Equipment and Method: Stylet Placement Confirmation: ETT inserted through vocal cords under direct vision, positive ETCO2 and breath sounds checked- equal and bilateral Secured at: 22 cm Tube secured with: Tape Dental Injury: Teeth and Oropharynx as per pre-operative assessment

## 2024-11-04 ENCOUNTER — Encounter (HOSPITAL_COMMUNITY): Payer: Self-pay | Admitting: Obstetrics and Gynecology

## 2024-11-04 NOTE — Anesthesia Postprocedure Evaluation (Signed)
"   Anesthesia Post Note  Patient: Janet Roach  Procedure(s) Performed: SALPINGECTOMY, BILATERAL, LAPAROSCOPIC (Bilateral)     Patient location during evaluation: PACU Anesthesia Type: General Level of consciousness: awake and alert Pain management: pain level controlled Vital Signs Assessment: post-procedure vital signs reviewed and stable Respiratory status: spontaneous breathing, nonlabored ventilation and respiratory function stable Cardiovascular status: blood pressure returned to baseline and stable Postop Assessment: no apparent nausea or vomiting Anesthetic complications: no   No notable events documented.               Margarethe Virgen      "

## 2024-11-05 LAB — SURGICAL PATHOLOGY

## 2024-11-19 ENCOUNTER — Ambulatory Visit: Payer: Self-pay | Admitting: Obstetrics and Gynecology

## 2024-11-19 ENCOUNTER — Encounter: Payer: Self-pay | Admitting: Obstetrics and Gynecology

## 2024-11-19 VITALS — BP 122/77 | HR 69 | Ht 69.0 in | Wt 222.0 lb

## 2024-11-19 DIAGNOSIS — Z4889 Encounter for other specified surgical aftercare: Secondary | ICD-10-CM

## 2024-11-19 NOTE — Progress Notes (Signed)
" ° ° °  Subjective:    Janet Roach is a 29 y.o. female who presents to the clinic status post laparoscopic bilateral salpingectomy on 11/03/24. The patient is not having any pain.  Eating a regular diet without difficulty. Bowel movements are normal. No other significant postoperative concerns.  The following portions of the patient's history were reviewed and updated as appropriate: allergies, current medications, past family history, past medical history, past social history, past surgical history, and problem list..  Last pap smear was normal on 10/24/23.  Review of Systems Pertinent items are noted in HPI.   Objective:   BP 122/77   Pulse 69   Ht 5' 9 (1.753 m)   Wt 222 lb (100.7 kg)   LMP 11/18/2024 (Exact Date)   Breastfeeding Yes   BMI 32.78 kg/m  Constitutional:  Well-developed, well-nourished female in no acute distress.   Skin: Skin is warm and dry, no rash noted, not diaphoretic,no erythema, no pallor.  Cardiovascular: Normal heart rate noted  Respiratory: Effort and breath sounds normal, no problems with respiration noted  Abdomen: Soft, bowel sounds active, non-tender, no abnormal masses  Incision: Healing well, no drainage, no erythema, no hernia, no seroma, no swelling, no dehiscence, incisions well approximated  Pelvic:   Deferred   Surgical pathology () Normal bilateral tubes with paratubal cyst Assessment:   Doing well postoperatively.  Operative findings again reviewed. Pathology report discussed.   Plan:   1. Continue any current medications. 2. Wound care discussed. 3. Activity restrictions: none 4. Anticipated return to work: now. 5. Follow up as needed 6.  Routine preventative health maintenance measures emphasized. Please refer to After Visit Summary for other counseling recommendations.    Jerilynn Buddle, MD, FACOG Attending Obstetrician & Gynecologist Center for [facility], [facility]  "
